# Patient Record
Sex: Female | Born: 1965
Health system: Southern US, Community
[De-identification: ages and names within clinical notes are randomized; demographics above are authoritative.]

## PROBLEM LIST (undated history)

## (undated) DIAGNOSIS — T4145XA Adverse effect of unspecified anesthetic, initial encounter: Secondary | ICD-10-CM

## (undated) DIAGNOSIS — Z9889 Other specified postprocedural states: Secondary | ICD-10-CM

## (undated) DIAGNOSIS — H9193 Unspecified hearing loss, bilateral: Secondary | ICD-10-CM

## (undated) DIAGNOSIS — T8859XA Other complications of anesthesia, initial encounter: Secondary | ICD-10-CM

## (undated) DIAGNOSIS — R12 Heartburn: Secondary | ICD-10-CM

## (undated) DIAGNOSIS — R112 Nausea with vomiting, unspecified: Secondary | ICD-10-CM

## (undated) DIAGNOSIS — F419 Anxiety disorder, unspecified: Secondary | ICD-10-CM

## (undated) DIAGNOSIS — G2581 Restless legs syndrome: Secondary | ICD-10-CM

## (undated) DIAGNOSIS — E785 Hyperlipidemia, unspecified: Secondary | ICD-10-CM

## (undated) DIAGNOSIS — B009 Herpesviral infection, unspecified: Secondary | ICD-10-CM

## (undated) HISTORY — DX: Heartburn: R12

## (undated) HISTORY — PX: ENDOMETRIAL ABLATION: SHX621

## (undated) HISTORY — DX: Anxiety disorder, unspecified: F41.9

## (undated) HISTORY — PX: TUBAL LIGATION: SHX77

## (undated) HISTORY — PX: HYSTEROSCOPY: SHX211

## (undated) HISTORY — DX: Hyperlipidemia, unspecified: E78.5

## (undated) HISTORY — PX: DILATION AND CURETTAGE OF UTERUS: SHX78

## (undated) HISTORY — PX: ABDOMINAL HYSTERECTOMY: SHX81

---
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
## 1999-01-27 ENCOUNTER — Encounter: Payer: Self-pay | Admitting: Orthopedic Surgery

## 1999-01-27 ENCOUNTER — Ambulatory Visit (HOSPITAL_COMMUNITY): Admission: RE | Admit: 1999-01-27 | Discharge: 1999-01-27 | Payer: Self-pay | Admitting: Orthopedic Surgery

## 2002-11-23 HISTORY — PX: COCHLEAR IMPLANT: SUR684

## 2005-02-19 ENCOUNTER — Emergency Department: Payer: Self-pay | Admitting: Emergency Medicine

## 2006-11-23 HISTORY — PX: FOOT SURGERY: SHX648

## 2009-11-23 HISTORY — PX: FOOT SURGERY: SHX648

## 2010-06-03 ENCOUNTER — Ambulatory Visit: Payer: Self-pay | Admitting: Otolaryngology

## 2011-12-31 ENCOUNTER — Ambulatory Visit: Payer: Self-pay

## 2013-05-22 ENCOUNTER — Emergency Department: Payer: Self-pay | Admitting: Emergency Medicine

## 2013-05-22 LAB — COMPREHENSIVE METABOLIC PANEL
Albumin: 3.5 g/dL (ref 3.4–5.0)
Alkaline Phosphatase: 93 U/L (ref 50–136)
BUN: 12 mg/dL (ref 7–18)
Calcium, Total: 8.5 mg/dL (ref 8.5–10.1)
Co2: 26 mmol/L (ref 21–32)
Creatinine: 0.79 mg/dL (ref 0.60–1.30)
EGFR (African American): >60
EGFR (Non-African Amer.): >60
Glucose: 92 mg/dL (ref 65–99)
Osmolality: 277 (ref 275–301)
SGPT (ALT): 16 U/L (ref 12–78)
Total Protein: 7 g/dL (ref 6.4–8.2)

## 2013-05-22 LAB — URINALYSIS, COMPLETE
Bacteria: NONE SEEN
Bilirubin,UR: NEGATIVE
Ketone: NEGATIVE
Nitrite: NEGATIVE
Ph: 5 (ref 4.5–8.0)
Protein: NEGATIVE
Specific Gravity: 1.027 (ref 1.003–1.030)

## 2013-05-22 LAB — CBC
HCT: 33.1 % — ABNORMAL LOW (ref 35.0–47.0)
HGB: 10.9 g/dL — ABNORMAL LOW (ref 12.0–16.0)
MCH: 24.1 pg — ABNORMAL LOW (ref 26.0–34.0)
MCHC: 32.9 g/dL (ref 32.0–36.0)
MCV: 73 fL — ABNORMAL LOW (ref 80–100)
Platelet: 197 10*3/uL (ref 150–440)
RBC: 4.51 10*6/uL (ref 3.80–5.20)
RDW: 18.1 % — ABNORMAL HIGH (ref 11.5–14.5)
WBC: 5.4 10*3/uL (ref 3.6–11.0)

## 2014-05-14 ENCOUNTER — Emergency Department: Payer: Self-pay | Admitting: Emergency Medicine

## 2014-05-14 LAB — URINALYSIS, COMPLETE
Bacteria: NONE SEEN
Bilirubin,UR: NEGATIVE
Ketone: NEGATIVE
LEUKOCYTE ESTERASE: NEGATIVE
NITRITE: NEGATIVE
PH: 5 (ref 4.5–8.0)
RBC,UR: 13511 /HPF (ref 0–5)
SPECIFIC GRAVITY: 1.024 (ref 1.003–1.030)
Squamous Epithelial: 11

## 2014-05-14 LAB — CBC
HCT: 28.6 % — ABNORMAL LOW (ref 35.0–47.0)
HGB: 9.3 g/dL — ABNORMAL LOW (ref 12.0–16.0)
MCH: 26.4 pg (ref 26.0–34.0)
MCHC: 32.4 g/dL (ref 32.0–36.0)
MCV: 82 fL (ref 80–100)
PLATELETS: 168 10*3/uL (ref 150–440)
RBC: 3.51 10*6/uL — AB (ref 3.80–5.20)
RDW: 17.2 % — ABNORMAL HIGH (ref 11.5–14.5)
WBC: 4.5 10*3/uL (ref 3.6–11.0)

## 2014-05-14 LAB — HCG, QUANTITATIVE, PREGNANCY: Beta Hcg, Quant.: <1 m[IU]/mL — ABNORMAL LOW

## 2014-06-19 ENCOUNTER — Ambulatory Visit: Payer: Self-pay | Admitting: Obstetrics and Gynecology

## 2014-06-19 LAB — CBC
HCT: 38.6 % (ref 35.0–47.0)
HGB: 12.7 g/dL (ref 12.0–16.0)
MCH: 30.6 pg (ref 26.0–34.0)
MCHC: 32.8 g/dL (ref 32.0–36.0)
MCV: 93 fL (ref 80–100)
Platelet: 143 10*3/uL — ABNORMAL LOW (ref 150–440)
RBC: 4.13 10*6/uL (ref 3.80–5.20)
RDW: 17.9 % — AB (ref 11.5–14.5)
WBC: 4.4 10*3/uL (ref 3.6–11.0)

## 2014-06-19 LAB — COMPREHENSIVE METABOLIC PANEL
ALK PHOS: 70 U/L
ANION GAP: 8 (ref 7–16)
AST: 21 U/L (ref 15–37)
Albumin: 3.3 g/dL — ABNORMAL LOW (ref 3.4–5.0)
BILIRUBIN TOTAL: 0.3 mg/dL (ref 0.2–1.0)
BUN: 9 mg/dL (ref 7–18)
CALCIUM: 8.7 mg/dL (ref 8.5–10.1)
CREATININE: 0.56 mg/dL — AB (ref 0.60–1.30)
Chloride: 104 mmol/L (ref 98–107)
Co2: 26 mmol/L (ref 21–32)
EGFR (African American): >60
Glucose: 93 mg/dL (ref 65–99)
Osmolality: 274 (ref 275–301)
Potassium: 3.9 mmol/L (ref 3.5–5.1)
SGPT (ALT): 16 U/L
Sodium: 138 mmol/L (ref 136–145)
Total Protein: 6.4 g/dL (ref 6.4–8.2)

## 2014-06-28 ENCOUNTER — Ambulatory Visit: Payer: Self-pay | Admitting: Obstetrics and Gynecology

## 2014-07-02 LAB — PATHOLOGY REPORT

## 2014-08-18 ENCOUNTER — Observation Stay: Payer: Self-pay | Admitting: Obstetrics and Gynecology

## 2014-08-18 LAB — PREGNANCY, URINE: PREGNANCY TEST, URINE: NEGATIVE m[IU]/mL

## 2014-08-18 LAB — BASIC METABOLIC PANEL
Anion Gap: 6 — ABNORMAL LOW (ref 7–16)
BUN: 13 mg/dL (ref 7–18)
CO2: 23 mmol/L (ref 21–32)
CREATININE: 0.79 mg/dL (ref 0.60–1.30)
Calcium, Total: 7.3 mg/dL — ABNORMAL LOW (ref 8.5–10.1)
Chloride: 109 mmol/L — ABNORMAL HIGH (ref 98–107)
EGFR (African American): >60
EGFR (Non-African Amer.): >60
Glucose: 121 mg/dL — ABNORMAL HIGH (ref 65–99)
Osmolality: 277 (ref 275–301)
Potassium: 3.6 mmol/L (ref 3.5–5.1)
Sodium: 138 mmol/L (ref 136–145)

## 2014-08-18 LAB — CBC
HCT: 16.7 % — AB (ref 35.0–47.0)
HGB: 5.8 g/dL — ABNORMAL LOW (ref 12.0–16.0)
MCH: 32.3 pg (ref 26.0–34.0)
MCHC: 34.6 g/dL (ref 32.0–36.0)
MCV: 93 fL (ref 80–100)
PLATELETS: 144 10*3/uL — AB (ref 150–440)
RBC: 1.79 10*6/uL — ABNORMAL LOW (ref 3.80–5.20)
RDW: 14.8 % — ABNORMAL HIGH (ref 11.5–14.5)
WBC: 10.9 10*3/uL (ref 3.6–11.0)

## 2014-08-18 LAB — URINALYSIS, COMPLETE
BACTERIA: NONE SEEN
BACTERIA: NONE SEEN
RBC,UR: 6953 /HPF (ref 0–5)
RBC,UR: 8606 /HPF (ref 0–5)
SPECIFIC GRAVITY: 1.025 (ref 1.003–1.030)
SPECIFIC GRAVITY: 1.02 (ref 1.003–1.030)
SQUAMOUS EPITHELIAL: NONE SEEN
Squamous Epithelial: 3
WBC UR: 170 /HPF (ref 0–5)
WBC UR: 450 /HPF (ref 0–5)

## 2014-08-18 LAB — CBC WITH DIFFERENTIAL/PLATELET
BASOS ABS: 0.1 10*3/uL (ref 0.0–0.1)
Basophil %: 0.9 %
EOS PCT: 0.4 %
Eosinophil #: 0 10*3/uL (ref 0.0–0.7)
HCT: 22.3 % — ABNORMAL LOW (ref 35.0–47.0)
HGB: 7.3 g/dL — AB (ref 12.0–16.0)
Lymphocyte #: 2 10*3/uL (ref 1.0–3.6)
Lymphocyte %: 18.8 %
MCH: 29.5 pg (ref 26.0–34.0)
MCHC: 32.9 g/dL (ref 32.0–36.0)
MCV: 90 fL (ref 80–100)
MONO ABS: 0.6 x10 3/mm (ref 0.2–0.9)
MONOS PCT: 5.4 %
Neutrophil #: 7.7 10*3/uL — ABNORMAL HIGH (ref 1.4–6.5)
Neutrophil %: 74.5 %
Platelet: 113 10*3/uL — ABNORMAL LOW (ref 150–440)
RBC: 2.48 10*6/uL — ABNORMAL LOW (ref 3.80–5.20)
RDW: 16.2 % — ABNORMAL HIGH (ref 11.5–14.5)
WBC: 10.4 10*3/uL (ref 3.6–11.0)

## 2014-08-19 LAB — CBC WITH DIFFERENTIAL/PLATELET
Basophil #: 0 10*3/uL (ref 0.0–0.1)
Basophil %: 0.5 %
Eosinophil #: 0 10*3/uL (ref 0.0–0.7)
Eosinophil %: 0.7 %
HCT: 21.9 % — AB (ref 35.0–47.0)
HGB: 7.2 g/dL — AB (ref 12.0–16.0)
LYMPHS PCT: 27.3 %
Lymphocyte #: 1.8 10*3/uL (ref 1.0–3.6)
MCH: 29.4 pg (ref 26.0–34.0)
MCHC: 32.7 g/dL (ref 32.0–36.0)
MCV: 90 fL (ref 80–100)
Monocyte #: 0.4 x10 3/mm (ref 0.2–0.9)
Monocyte %: 5.5 %
NEUTROS PCT: 66 %
Neutrophil #: 4.3 10*3/uL (ref 1.4–6.5)
Platelet: 104 10*3/uL — ABNORMAL LOW (ref 150–440)
RBC: 2.44 10*6/uL — AB (ref 3.80–5.20)
RDW: 17.3 % — AB (ref 11.5–14.5)
WBC: 6.6 10*3/uL (ref 3.6–11.0)

## 2014-08-22 ENCOUNTER — Ambulatory Visit: Payer: Self-pay | Admitting: Obstetrics and Gynecology

## 2014-08-22 LAB — COMPREHENSIVE METABOLIC PANEL
Albumin: 3 g/dL — ABNORMAL LOW (ref 3.4–5.0)
Alkaline Phosphatase: 46 U/L
Anion Gap: 4 — ABNORMAL LOW (ref 7–16)
BUN: 7 mg/dL (ref 7–18)
Bilirubin,Total: 0.4 mg/dL (ref 0.2–1.0)
Calcium, Total: 8 mg/dL — ABNORMAL LOW (ref 8.5–10.1)
Chloride: 111 mmol/L — ABNORMAL HIGH (ref 98–107)
Co2: 24 mmol/L (ref 21–32)
Creatinine: 0.63 mg/dL (ref 0.60–1.30)
EGFR (African American): >60
EGFR (Non-African Amer.): >60
GLUCOSE: 82 mg/dL (ref 65–99)
Osmolality: 275 (ref 275–301)
Potassium: 4.3 mmol/L (ref 3.5–5.1)
SGOT(AST): 20 U/L (ref 15–37)
SGPT (ALT): 15 U/L
Sodium: 139 mmol/L (ref 136–145)
Total Protein: 6.1 g/dL — ABNORMAL LOW (ref 6.4–8.2)

## 2014-08-22 LAB — CBC
HCT: 27.4 % — AB (ref 35.0–47.0)
HGB: 8.9 g/dL — ABNORMAL LOW (ref 12.0–16.0)
MCH: 30.8 pg (ref 26.0–34.0)
MCHC: 32.6 g/dL (ref 32.0–36.0)
MCV: 95 fL (ref 80–100)
PLATELETS: 161 10*3/uL (ref 150–440)
RBC: 2.9 10*6/uL — AB (ref 3.80–5.20)
RDW: 18.4 % — ABNORMAL HIGH (ref 11.5–14.5)
WBC: 4.6 10*3/uL (ref 3.6–11.0)

## 2014-08-23 ENCOUNTER — Ambulatory Visit: Payer: Self-pay | Admitting: Obstetrics and Gynecology

## 2014-08-25 LAB — PATHOLOGY REPORT

## 2014-10-05 DIAGNOSIS — D649 Anemia, unspecified: Secondary | ICD-10-CM | POA: Insufficient documentation

## 2014-10-05 DIAGNOSIS — K219 Gastro-esophageal reflux disease without esophagitis: Secondary | ICD-10-CM | POA: Insufficient documentation

## 2014-10-06 DIAGNOSIS — E559 Vitamin D deficiency, unspecified: Secondary | ICD-10-CM | POA: Insufficient documentation

## 2014-11-08 ENCOUNTER — Ambulatory Visit: Payer: Self-pay | Admitting: Emergency Medicine

## 2015-03-04 ENCOUNTER — Ambulatory Visit
Admit: 2015-03-04 | Disposition: A | Discharge status: Home / Self Care | Payer: Self-pay | Attending: Family Medicine | Admitting: Family Medicine

## 2015-03-16 NOTE — Op Note (Signed)
PATIENT NAME:  Christina Bates, Christina Bates MR#:  [id number] DATE OF BIRTH:  1966-05-26  DATE OF PROCEDURE:  06/28/2014  PREOPERATIVE DIAGNOSIS: Abnormal uterine bleeding, likely due to polyps.   POSTOPERATIVE DIAGNOSIS: Abnormal uterine bleeding, likely due to polyps.   PROCEDURE:  1. Dilation and curettage.  2. Hysteroscopy.  3. Polypectomy.   ANESTHESIA: General.   SURGEON: Dr. Thomasene MohairStephen Jackson.   ESTIMATED BLOOD LOSS: 100 mL.  OPERATIVE FLUIDS: 700 mL crystalloid.   COMPLICATIONS: None.   FINDINGS: Multiple polypoid endometrial lesions.   SPECIMENS:  1. Endometrial curettings.  2. Endometrial polypoid lesions.   CONDITION AT THE END OF PROCEDURE: Stable.   PROCEDURE IN DETAIL: The patient was taken to the operating room where general anesthesia was administered and found to be adequate. The patient was placed in the dorsal supine high lithotomy position in candy cane stirrups with great care to minimize risk of damage to the nerves. The patient was prepped and draped in the usual sterile fashion. After a timeout was called, a red rubber catheter was placed in the patient's bladder to empty her bladder for the procedure. There was return of approximately 250 mL of clear urine. A sterile speculum was placed in the vagina and a single-tooth tenaculum grasped the anterior lip of the cervix. The cervix was dilated gently in a serial fashion to a dilatation of 6 mm using Hegar dilators. The MyoSure hysteroscope was introduced gently through the cervix into the uterine cavity with the above-noted findings. Using the MyoSure device, the polypoid lesions were removed without difficulty. The MyoSure hysteroscope was removed and a gentle curettage was performed for a global sampling. The hysteroscope was reintroduced through the cervix into the uterus to assure hemostasis, which was noted even after lowering the intrauterine fluid pressure to well below that of the patient's mean arterial pressure. The  hysteroscope was removed from the cervix and vagina. The single-tooth tenaculum was removed from the anterior lip of the cervix and with silver nitrate, hemostasis was obtained. The speculum was removed after assurance that no instrumentation or sponges were left in the vagina.   The patient tolerated the procedure well. Sponge, lap, and needle counts were correct x 2. For VTE prophylaxis, the patient was wearing pneumatic compression stockings throughout the entire procedure. She was awakened in the operating room and taken to the recovery area in stable condition.    ____________________________ Conard NovakStephen D. Jackson, MD sdj:lt D: 06/28/2014 16:10:59 ET T: 06/28/2014 21:00:31 ET JOB#: [id number]  cc: Conard NovakStephen D. Jackson, MD, <Dictator> Conard NovakSTEPHEN D JACKSON MD ELECTRONICALLY SIGNED 08/08/2014 14:47

## 2015-03-16 NOTE — Op Note (Signed)
PATIENT NAME:  Christina Bates, Christina Bates MR#:  [id number] DATE OF BIRTH:  Aug 24, 1966  DATE OF PROCEDURE:  08/23/2014  PREOPERATIVE DIAGNOSES:  1. Menorrhagia with irregular cycles.  2. Acute blood loss anemia due to heavy vaginal bleeding.   POSTOPERATIVE DIAGNOSES:  1. Menorrhagia with irregular cycles.  2. Acute blood loss anemia due to heavy vaginal bleeding.   PROCEDURES:  1. Dilation and curettage.  2. Hysteroscopy.  3. NovaSure endometrial ablation.   ANESTHESIA: General.   SURGEON: Conard NovakStephen D. Delphia Kaylor, M.D.   ESTIMATED BLOOD LOSS: 10 mL.   OPERATIVE FLUIDS: 400 mL crystalloid.   COMPLICATIONS: None.   FINDINGS: Possible posterior endometrial polyp.   SPECIMENS: Endometrial curettings.   CONDITION AT END OF PROCEDURE: Stable.   PROCEDURE IN DETAIL: The patient was taken to the operating room where general anesthesia was administered and found to be adequate. The patient was placed in the dorsal supine high lithotomy position in candy cane stirrups, and prepped and draped in the usual sterile fashion. Great care was  taken to ensure the patient was positioned such as to minimize risk to nerve damage. After a timeout was called, an in and out catheterization was performed for a minimal return of clear urine. A sterile speculum was placed in the vagina and a single-tooth tenaculum was affixed to the anterior lip of the cervix. The uterus was sounded to a depth of 9 cm and the cervical length was measured at 4 cm giving it a cavity depth of 5 cm.   The hysteroscope was then gently introduced into the cavity with the above-noted findings. A gentle curettage was performed for a global sample. The NovaSure device was then introduced through the cervix, and as recommended by the manufacturer, with the usual maneuvers implemented in order to obtain a proper cavity width, which was noted to be 4.6 cm. Cavity assessment was performed using the NovaSure machine according to the manufacturer's  recommendations and the cavity assessment passed. The NovaSure device was then deployed and power was generated, a total of 127 for a total of 95 seconds. The device was removed in the recommended fashion. The hysteroscope was then gently introduced into the cavity to assess the globalization of the ablation. Notably the right cornu had some area without ablation. The cavity was a bit arcuate in shape with the cornu on the right side recessed somewhat deeply; therefore, the deployment was reasonable and no further ablation was performed.   The hysteroscope was removed from the uterine cavity and the single-tooth tenaculum was removed from the cervix with hemostasis noted. The speculum was removed from the vagina with assurance that no instrumentation or sponges were left in the vagina. This terminated the procedure.   The patient tolerated the procedure well. Sponge, lap, and needle counts were correct x 2. For VTE prophylaxis, the patient was wearing pneumatic compression stockings throughout the entire procedure. She was awakened in the operating room and taken to recovery in stable condition.  ____________________________ Conard NovakStephen D. Ryler Laskowski, MD sdj:JT D: 08/23/2014 11:20:42 ET T: 08/23/2014 13:45:58 ET JOB#: [id number]  cc: Conard NovakStephen D. Debbora Ang, MD, <Dictator> Conard NovakSTEPHEN D Renay Crammer MD ELECTRONICALLY SIGNED 09/11/2014 18:48

## 2015-03-16 NOTE — Consult Note (Signed)
Brief Consult Note: Diagnosis: Menorrhgia to anema.   Recommend further assessment or treatment.   Orders entered.   Discussed with Attending MD.   Comments: Case discussed with Dr. Dolores FrameSung acute blood loss anemia secondary to menorrhagia - 2U pRBC - Provera 20mg  tab po tid x 1 week followed by 20mg  tab po daily to manage acute episode of bleeding - May be a novasure or Mirena candidate for long term managment.  Electronic Signatures: Lorrene ReidStaebler, Ehtan Delfavero M (MD)  (Signed 26-Sep-15 03:11)  Authored: Brief Consult Note   Last Updated: 26-Sep-15 03:11 by Lorrene ReidStaebler, Armanda Forand M (MD)

## 2015-03-18 ENCOUNTER — Encounter: Payer: Self-pay | Admitting: Podiatry

## 2015-03-18 ENCOUNTER — Ambulatory Visit (INDEPENDENT_AMBULATORY_CARE_PROVIDER_SITE_OTHER): Payer: No Typology Code available for payment source | Admitting: Podiatry

## 2015-03-18 ENCOUNTER — Ambulatory Visit (INDEPENDENT_AMBULATORY_CARE_PROVIDER_SITE_OTHER): Payer: No Typology Code available for payment source

## 2015-03-18 ENCOUNTER — Other Ambulatory Visit: Payer: Self-pay | Admitting: *Deleted

## 2015-03-18 VITALS — BP 116/79 | HR 69 | Resp 16 | Ht 62.0 in | Wt 155.0 lb

## 2015-03-18 DIAGNOSIS — M76822 Posterior tibial tendinitis, left leg: Secondary | ICD-10-CM | POA: Diagnosis not present

## 2015-03-18 DIAGNOSIS — M79672 Pain in left foot: Secondary | ICD-10-CM

## 2015-03-18 DIAGNOSIS — M779 Enthesopathy, unspecified: Secondary | ICD-10-CM

## 2015-03-18 MED ORDER — MELOXICAM 15 MG PO TABS: 15.0000 mg | ORAL_TABLET | Freq: Every day | ORAL | Status: DC

## 2015-03-18 MED ORDER — METHYLPREDNISOLONE 4 MG PO TBPK: ORAL_TABLET | ORAL | Status: DC

## 2015-03-19 NOTE — Progress Notes (Signed)
She presents today to complain of pain that started several weeks ago a hind that he'll has progressed along side of the foot extending across the autumn of the foot particularly around Lisfranc's joints from medial to lateral. She also is radiating pain. She denies any trauma to the foot. She states however now the majority of the pain is writing here patient points to the medial aspect of the foot and ankle. She denies changes in her past medical history medications allergies surgeries and social history.  Objective: Vital signs are stable she is alert and oriented 3. Pulses are palpable bilateral. Neurologic sensorium is intact. She has no pain on palpation of the Achilles tendon she does have mild tenderness on palpation of the plantar fascia left the majority of her pain is along the medial aspect of the foot coursing along with the posterior tibial tendon particularly beneath the medial malleolus extending to the navicular tuberosity and plantarly. Radiographs do not demonstrate any type of osseus abnormalities in this area.  Assessment: Posterior tibial tendinitis with fluctuance within the tendon sheath. Left foot.  Plan: Placed her in a Cam Walker. Started her on a Medrol Dosepak to be followed by meloxicam.

## 2015-03-20 DIAGNOSIS — E538 Deficiency of other specified B group vitamins: Secondary | ICD-10-CM | POA: Insufficient documentation

## 2015-04-15 ENCOUNTER — Encounter: Payer: Self-pay | Admitting: Podiatry

## 2015-04-15 ENCOUNTER — Ambulatory Visit (INDEPENDENT_AMBULATORY_CARE_PROVIDER_SITE_OTHER): Payer: No Typology Code available for payment source | Admitting: Podiatry

## 2015-04-15 VITALS — Ht 62.0 in | Wt 155.0 lb

## 2015-04-15 DIAGNOSIS — M76822 Posterior tibial tendinitis, left leg: Secondary | ICD-10-CM | POA: Diagnosis not present

## 2015-04-15 DIAGNOSIS — M79672 Pain in left foot: Secondary | ICD-10-CM | POA: Diagnosis not present

## 2015-04-15 NOTE — Progress Notes (Signed)
She presents today with her husband for follow-up of her posterior tibial tendinitis of her left foot. She states and he confirms that she continues to wear her short cam walker on a regular basis. She states that it no longer hurts.  Objective: Vital signs are stable alert and oriented 3. Pulses are palpable left. She has mild fluctuance on palpation of the posterior tibial tendon just inferior to medial malleolus. She has mild tenderness on palpation of the posterior tibial tendon itself.  Assessment: Resolving posterior tibial tendinitis left.  Plan: Placed her in a Tri-Lock brace and will follow up with her in 1 month to 6 weeks

## 2015-05-29 ENCOUNTER — Ambulatory Visit (INDEPENDENT_AMBULATORY_CARE_PROVIDER_SITE_OTHER): Payer: No Typology Code available for payment source | Admitting: Podiatry

## 2015-05-29 ENCOUNTER — Encounter: Payer: Self-pay | Admitting: Podiatry

## 2015-05-29 DIAGNOSIS — M76822 Posterior tibial tendinitis, left leg: Secondary | ICD-10-CM

## 2015-05-29 NOTE — Progress Notes (Signed)
She presents today for follow-up of her posterior tibial tendinitis of her left foot. She states this seems to be doing fine I have very little pain. She is no longer taking the meloxicam. She does confirm that she continues to wear her ankle brace.  Objective: Vital signs are stable alert and oriented 3. Pulses are strongly palpable. She has very mild tenderness on palpation of the posterior tibial tendon at the inferior most aspect of the medial malleolus. There is a small amount of fluctuance in this area. She has full range of motion and full strength in this area.  Assessment: Posterior tibial tendinitis left.  Plan: I encouraged her to continue anti-inflammatory, the ankle brace and ice therapy. I will follow up with her in 6 weeks or as needed.

## 2015-07-23 ENCOUNTER — Ambulatory Visit
Admit: 2015-07-23 | Discharge: 2015-07-23 | Disposition: A | Discharge status: Home / Self Care | Payer: No Typology Code available for payment source | Attending: Emergency Medicine | Admitting: Emergency Medicine

## 2015-07-23 ENCOUNTER — Encounter: Payer: Self-pay | Admitting: Emergency Medicine

## 2015-07-23 ENCOUNTER — Ambulatory Visit
Admission: EM | Admit: 2015-07-23 | Discharge: 2015-07-23 | Disposition: A | Discharge status: Home / Self Care | Payer: No Typology Code available for payment source | Attending: Family Medicine | Admitting: Family Medicine

## 2015-07-23 DIAGNOSIS — R519 Headache, unspecified: Secondary | ICD-10-CM

## 2015-07-23 DIAGNOSIS — R51 Headache: Secondary | ICD-10-CM

## 2015-07-23 MED ORDER — KETOROLAC TROMETHAMINE 60 MG/2ML IM SOLN
60.0000 mg | Freq: Once | INTRAMUSCULAR | Status: AC
  Administered 2015-07-23: 60 mg via INTRAMUSCULAR

## 2015-07-23 MED ORDER — PROMETHAZINE HCL 25 MG/ML IJ SOLN
12.5000 mg | Freq: Four times a day (QID) | INTRAMUSCULAR | Status: DC | PRN
  Administered 2015-07-23: 12.5 mg via INTRAMUSCULAR

## 2015-07-23 NOTE — ED Notes (Signed)
Scheduled CT of head w/o contrast at [facility].

## 2015-07-23 NOTE — ED Provider Notes (Signed)
[facility] Emergency Department Provider Note  ____________________________________________  Time seen: Approximately 3:08 PM  I have reviewed the triage vital signs and the nursing notes.   HISTORY  Chief Complaint Headache   HPI Christina Bates is a 49 y.o. female presents with husband at bedside for the complaints of right-sided headache. Patient states that right-sided headache onset was yesterday morning present upon wakening. States yesterday headache was constant with intermittent sharp stabbing right-sided pains. Describes pain as a throbbing sensation. States she took over-the-counter ibuprofen and a "left over Vicodin "which helped but did not fully resolve pain. Patient states that pain was still present when she went to bed last night. States the pain had dramatically improved this morning when she woke up and no longer constant. States that she was originally not going to seek care but then she still noticed that she still having intermittent right throbbing pain. States that intermittent pain varies and may come on several times per hour to once. States that it is not constant. States that she is currently not in any pain. States that when pain is present it is 7 out of 10 and throbbing to the right side. Denies pain radiation. States there is nothing that she can do that worsens pain. Denies pain changes with positions changes.   Denies current pain. Denies dizziness, vision changes, fall, head injury, loss consciousness, neck pain, back pain. Denies fever. Denies recent sickness. Denies sinus congestion, cough, fever. Denies chest pain, shortness of breath, abdominal pain. Reports continues to eat and drink well.  Reports she has a history of some headaches. States these headaches are primarily tension type headaches and this feels different.   History reviewed. No pertinent past medical history.  Patient Active Problem List   Diagnosis Date Noted   . Avitaminosis D 10/06/2014  . Absolute anemia 10/05/2014  . Gastro-esophageal reflux disease without esophagitis 10/05/2014    Past Surgical History  Procedure Laterality Date  . Cochlear implant      Current Outpatient Rx  Name  Route  Sig  Dispense  Refill  . ferrous sulfate 325 (65 FE) MG tablet   Oral   Take 325 mg by mouth daily with breakfast.         . ibuprofen (ADVIL,MOTRIN) 600 MG tablet            1   . meloxicam (MOBIC) 15 MG tablet   Oral   Take 1 tablet (15 mg total) by mouth daily.   30 tablet   2   . methylPREDNISolone (MEDROL DOSEPAK) 4 MG TBPK tablet      Tapering 6 day dos   21 tablet   0   . pantoprazole (PROTONIX) 40 MG tablet            2   . valACYclovir (VALTREX) 1000 MG tablet            3   . Vitamin D, Ergocalciferol, (DRISDOL) 50000 UNITS CAPS capsule            0     Allergies Bacitracin  History reviewed. No pertinent family history.  Social History Social History  Substance Use Topics  . Smoking status: Never Smoker   . Smokeless tobacco: None  . Alcohol Use: 0.0 oz/week    0 Standard drinks or equivalent per week    Review of Systems Constitutional: No fever/chills Eyes: No visual changes. ENT: No sore throat. Cardiovascular: Denies chest pain. Respiratory: Denies shortness of  breath. Gastrointestinal: No abdominal pain.  No nausea, no vomiting.  No diarrhea.  No constipation. Genitourinary: Negative for dysuria. Musculoskeletal: Negative for back pain. Skin: Negative for rash. Neurological: positive for headaches. Negative for focal weakness or numbness.  10-point ROS otherwise negative.  ____________________________________________   PHYSICAL EXAM:  VITAL SIGNS: ED Triage Vitals  Enc Vitals Group     BP 07/23/15 1441 129/77 mmHg     Pulse Rate 07/23/15 1441 68     Resp 07/23/15 1441 16     Temp 07/23/15 1441 97.2 F (36.2 C)     Temp Source 07/23/15 1441 Oral     SpO2 07/23/15 1441  100 %     Weight 07/23/15 1441 150 lb (68.04 kg)     Height 07/23/15 1441  (1.575 m)     Head Cir --      Peak Flow --      Pain Score 07/23/15 1445 9     Pain Loc --      Pain Edu? --      Excl. in GC? --     Constitutional: Alert and oriented. Well appearing and in no acute distress. Eyes: Conjunctivae are normal. PERRL. EOMI. Anterior chambers with limited bedside exam normal, no papilledema.  Head: Atraumatic.no tenderness over temporal arteries. No sinus TTP. Left sided cochlear implant. Nontender. No tenderness to palpation.   Ears: no erythema, normal TMs bilaterally.   Nose: No congestion/rhinnorhea.  Mouth/Throat: Mucous membranes are moist.  Oropharynx non-erythematous. Neck: No stridor.  No cervical spine tenderness to palpation. Hematological/Lymphatic/Immunilogical: No cervical lymphadenopathy. Cardiovascular: Normal rate, regular rhythm. Grossly normal heart sounds.  Good peripheral circulation. Respiratory: Normal respiratory effort.  No retractions. Lungs CTAB. Gastrointestinal: Soft and nontender. No distention. Normal Bowel sounds.  No abdominal bruits. No CVA tenderness. Musculoskeletal: No lower or upper extremity tenderness nor edema.  No joint effusions. Bilateral pedal pulses equal and easily palpated. No cervical, thoracic or lumbar TTP.  Neurologic:  Normal speech and language. No gross focal neurologic deficits are appreciated. CN 2-12 intact. No gait instability. No ataxia, normal finger to nose. Negative Romberg. Negative brudzkinski's and negative kernig's. No meningismus.   Skin:  Skin is warm, dry and intact. No rash noted. Psychiatric: Mood and affect are normal. Speech and behavior are normal. _________________________________________   LABS (all labs ordered are listed, but only abnormal results are displayed)  Labs Reviewed - No data to display ____________________________________________  RADIOLOGY EXAM: CT HEAD WITHOUT  CONTRAST  TECHNIQUE: Contiguous axial images were obtained from the base of the skull through the vertex without intravenous contrast.  COMPARISON: None.  FINDINGS: Moderate artifact related to LEFT cochlear implant. Overall the study is diagnostic.  No evidence for acute infarction, hemorrhage, mass lesion, hydrocephalus, or extra-axial fluid. Normal cerebral volume. No white matter disease. Calvarium is intact except for small wires which penetrate the LEFT temporal bone, related to the cochlear implant device. Prior LEFT mastoidectomy. No sinus or mastoid air fluid level. Negative orbits.  IMPRESSION: Negative exam. Postsurgical changes related to LEFT cochlear implant.   Electronically Signed By: Elsie Stain M.D. On: 07/23/2015 16:37      I, Renford Dills, personally viewed and evaluated these images (plain radiographs) as part of my medical decision making.   ____________________________________________   PROCEDURES  IM toradol  and phenergan 12.5mg  once.  ____________________________________________   INITIAL IMPRESSION / ASSESSMENT AND PLAN / ED COURSE  Pertinent labs & imaging results that were available during my care of  the patient were reviewed by me and considered in my medical decision making (see chart for details).  Very well-appearing patient. No acute distress. Presents with husband at bedside. Reports intermittent headache today. States headache onset was yesterday morning when she awoke. Denies head injury or loss consciousness. Stable patient. No focal neurological deficits. Presents for atypical headache for her. Will evaluate by CT scan.   1555 CT scan currently unavailable at this facility. Patient and spouse reports wants to go forward with CT of head and reports wants to drive to hospital for CT. Ct head ordered. RN called hospital and arranged and directed patient. Headache present x 2 days. Patient alert and oriented with  decisional capacity. Patient and husband reports will return to Urgent care for follow up post CT result.   1637: Ct head negative. No acute intracranial infarction, hemorrhage, mass lesion hydrocephalus or extra axial fluid.   1715: Patient and spouse at bedside. IM toradol and phenergan x one. States pain was 3/10 just prior to IM medication, but states improving. States she is feeling better. Denies other complaints. No neurological deficits. States she is feeling better and wants to go home. Reports headache improved.   Discussed strict follow up with PCP. Follow up with PCP Dr Thedore Mins this week. Discussed immediate return parameters including going to ER, including increased pain, confusion, weakness, dizziness, visions changes, or worsening concerns. Patient and spouse verbalized understanding and agreed to plan.    ____________________________________________   FINAL CLINICAL IMPRESSION(S) / ED DIAGNOSES  Final diagnoses:  Acute nonintractable headache, unspecified headache type       Renford Dills, NP 07/23/15 2152

## 2015-07-23 NOTE — ED Notes (Signed)
Patient c/o stabbing pain in her head that comes and goes and started yesterday morning.  Patient denies vomiting.

## 2015-07-23 NOTE — ED Notes (Signed)
Patient has left with spouse and is being taken over by private vehicle to [facility] for her CT scan.  Patient instructed to check-in at the Registration Desk in [facility] [place] at [facility].  Patient will be turning back to [facility] after her CT scan for possible administration of pain medication for her headache.  Patient verbalized understanding.

## 2015-07-23 NOTE — Discharge Instructions (Signed)
Rest. Drink plenty of water. Avoid stressful triggers and over strenuous activity.   Follow up with your primary care physician. Follow up closely this week. This is important.   Return to Urgent care or ER for increased headache, headache not resolving, confusion, dizziness, weakness, vision changes, new or worsening concerns.   Headaches, Frequently Asked Questions MIGRAINE HEADACHES Q: What is migraine? What causes it? How can I treat it? A: Generally, migraine headaches begin as a dull ache. Then they develop into a constant, throbbing, and pulsating pain. You may experience pain at the temples. You may experience pain at the front or back of one or both sides of the head. The pain is usually accompanied by a combination of:  Nausea.  Vomiting.  Sensitivity to light and noise. Some people (about 15%) experience an aura (see below) before an attack. The cause of migraine is believed to be chemical reactions in the brain. Treatment for migraine may include over-the-counter or prescription medications. It may also include self-help techniques. These include relaxation training and biofeedback.  Q: What is an aura? A: About 15% of people with migraine get an "aura". This is a sign of neurological symptoms that occur before a migraine headache. You may see wavy or jagged lines, dots, or flashing lights. You might experience tunnel vision or blind spots in one or both eyes. The aura can include visual or auditory hallucinations (something imagined). It may include disruptions in smell (such as strange odors), taste or touch. Other symptoms include:  Numbness.  A "pins and needles" sensation.  Difficulty in recalling or speaking the correct word. These neurological events may last as long as 60 minutes. These symptoms will fade as the headache begins. Q: What is a trigger? A: Certain physical or environmental factors can lead to or "trigger" a migraine. These include:  Foods.  Hormonal  changes.  Weather.  Stress. It is important to remember that triggers are different for everyone. To help prevent migraine attacks, you need to figure out which triggers affect you. Keep a headache diary. This is a good way to track triggers. The diary will help you talk to your healthcare professional about your condition. Q: Does weather affect migraines? A: Bright sunshine, hot, humid conditions, and drastic changes in barometric pressure may lead to, or "trigger," a migraine attack in some people. But studies have shown that weather does not act as a trigger for everyone with migraines. Q: What is the link between migraine and hormones? A: Hormones start and regulate many of your body's functions. Hormones keep your body in balance within a constantly changing environment. The levels of hormones in your body are unbalanced at times. Examples are during menstruation, pregnancy, or menopause. That can lead to a migraine attack. In fact, about three quarters of all women with migraine report that their attacks are related to the menstrual cycle.  Q: Is there an increased risk of stroke for migraine sufferers? A: The likelihood of a migraine attack causing a stroke is very remote. That is not to say that migraine sufferers cannot have a stroke associated with their migraines. In persons under age 39, the most common associated factor for stroke is migraine headache. But over the course of a person's normal life span, the occurrence of migraine headache may actually be associated with a reduced risk of dying from cerebrovascular disease due to stroke.  Q: What are acute medications for migraine? A: Acute medications are used to treat the pain of the headache  after it has started. Examples over-the-counter medications, NSAIDs, ergots, and triptans.  Q: What are the triptans? A: Triptans are the newest class of abortive medications. They are specifically targeted to treat migraine. Triptans are  vasoconstrictors. They moderate some chemical reactions in the brain. The triptans work on receptors in your brain. Triptans help to restore the balance of a neurotransmitter called serotonin. Fluctuations in levels of serotonin are thought to be a main cause of migraine.  Q: Are over-the-counter medications for migraine effective? A: Over-the-counter, or "OTC," medications may be effective in relieving mild to moderate pain and associated symptoms of migraine. But you should see your caregiver before beginning any treatment regimen for migraine.  Q: What are preventive medications for migraine? A: Preventive medications for migraine are sometimes referred to as "prophylactic" treatments. They are used to reduce the frequency, severity, and length of migraine attacks. Examples of preventive medications include antiepileptic medications, antidepressants, beta-blockers, calcium channel blockers, and NSAIDs (nonsteroidal anti-inflammatory drugs). Q: Why are anticonvulsants used to treat migraine? A: During the past few years, there has been an increased interest in antiepileptic drugs for the prevention of migraine. They are sometimes referred to as "anticonvulsants". Both epilepsy and migraine may be caused by similar reactions in the brain.  Q: Why are antidepressants used to treat migraine? A: Antidepressants are typically used to treat people with depression. They may reduce migraine frequency by regulating chemical levels, such as serotonin, in the brain.  Q: What alternative therapies are used to treat migraine? A: The term "alternative therapies" is often used to describe treatments considered outside the scope of conventional Western medicine. Examples of alternative therapy include acupuncture, acupressure, and yoga. Another common alternative treatment is herbal therapy. Some herbs are believed to relieve headache pain. Always discuss alternative therapies with your caregiver before proceeding. Some  herbal products contain arsenic and other toxins. TENSION HEADACHES Q: What is a tension-type headache? What causes it? How can I treat it? A: Tension-type headaches occur randomly. They are often the result of temporary stress, anxiety, fatigue, or anger. Symptoms include soreness in your temples, a tightening band-like sensation around your head (a "vice-like" ache). Symptoms can also include a pulling feeling, pressure sensations, and contracting head and neck muscles. The headache begins in your forehead, temples, or the back of your head and neck. Treatment for tension-type headache may include over-the-counter or prescription medications. Treatment may also include self-help techniques such as relaxation training and biofeedback. CLUSTER HEADACHES Q: What is a cluster headache? What causes it? How can I treat it? A: Cluster headache gets its name because the attacks come in groups. The pain arrives with little, if any, warning. It is usually on one side of the head. A tearing or bloodshot eye and a runny nose on the same side of the headache may also accompany the pain. Cluster headaches are believed to be caused by chemical reactions in the brain. They have been described as the most severe and intense of any headache type. Treatment for cluster headache includes prescription medication and oxygen. SINUS HEADACHES Q: What is a sinus headache? What causes it? How can I treat it? A: When a cavity in the bones of the face and skull (a sinus) becomes inflamed, the inflammation will cause localized pain. This condition is usually the result of an allergic reaction, a tumor, or an infection. If your headache is caused by a sinus blockage, such as an infection, you will probably have a fever. An x-ray will confirm  a sinus blockage. Your caregiver's treatment might include antibiotics for the infection, as well as antihistamines or decongestants.  REBOUND HEADACHES Q: What is a rebound headache? What  causes it? How can I treat it? A: A pattern of taking acute headache medications too often can lead to a condition known as "rebound headache." A pattern of taking too much headache medication includes taking it more than 2 days per week or in excessive amounts. That means more than the label or a caregiver advises. With rebound headaches, your medications not only stop relieving pain, they actually begin to cause headaches. Doctors treat rebound headache by tapering the medication that is being overused. Sometimes your caregiver will gradually substitute a different type of treatment or medication. Stopping may be a challenge. Regularly overusing a medication increases the potential for serious side effects. Consult a caregiver if you regularly use headache medications more than 2 days per week or more than the label advises. ADDITIONAL QUESTIONS AND ANSWERS Q: What is biofeedback? A: Biofeedback is a self-help treatment. Biofeedback uses special equipment to monitor your body's involuntary physical responses. Biofeedback monitors:  Breathing.  Pulse.  Heart rate.  Temperature.  Muscle tension.  Brain activity. Biofeedback helps you refine and perfect your relaxation exercises. You learn to control the physical responses that are related to stress. Once the technique has been mastered, you do not need the equipment any more. Q: Are headaches hereditary? A: Four out of five (80%) of people that suffer report a family history of migraine. Scientists are not sure if this is genetic or a family predisposition. Despite the uncertainty, a child has a 50% chance of having migraine if one parent suffers. The child has a 75% chance if both parents suffer.  Q: Can children get headaches? A: By the time they reach high school, most young people have experienced some type of headache. Many safe and effective approaches or medications can prevent a headache from occurring or stop it after it has begun.  Q:  What type of doctor should I see to diagnose and treat my headache? A: Start with your primary caregiver. Discuss his or her experience and approach to headaches. Discuss methods of classification, diagnosis, and treatment. Your caregiver may decide to recommend you to a headache specialist, depending upon your symptoms or other physical conditions. Having diabetes, allergies, etc., may require a more comprehensive and inclusive approach to your headache. The National Head[employer] Foundation will provide, upon request, a list of [facility] physician members in your state. Document Released: 01/30/2004 Document Revised: 02/01/2012 Document Reviewed: 07/09/2008 [facility] Patient Information 2015 [place], [place]. This information is not intended to replace advice given to you by your health care provider. Make sure you discuss any questions you have with your health care provider.

## 2015-08-15 DIAGNOSIS — E538 Deficiency of other specified B group vitamins: Secondary | ICD-10-CM | POA: Insufficient documentation

## 2015-08-15 DIAGNOSIS — A6000 Herpesviral infection of urogenital system, unspecified: Secondary | ICD-10-CM | POA: Insufficient documentation

## 2015-11-12 ENCOUNTER — Encounter
Admission: RE | Admit: 2015-11-12 | Discharge: 2015-11-12 | Disposition: A | Discharge status: Home / Self Care | Payer: 59 | Source: Ambulatory Visit | Attending: Obstetrics and Gynecology | Admitting: Obstetrics and Gynecology

## 2015-11-12 DIAGNOSIS — Z01812 Encounter for preprocedural laboratory examination: Secondary | ICD-10-CM | POA: Insufficient documentation

## 2015-11-12 HISTORY — DX: Adverse effect of unspecified anesthetic, initial encounter: T41.45XA

## 2015-11-12 HISTORY — DX: Other complications of anesthesia, initial encounter: T88.59XA

## 2015-11-12 HISTORY — DX: Other specified postprocedural states: Z98.890

## 2015-11-12 HISTORY — DX: Unspecified hearing loss, bilateral: H91.93

## 2015-11-12 HISTORY — DX: Herpesviral infection, unspecified: B00.9

## 2015-11-12 HISTORY — DX: Restless legs syndrome: G25.81

## 2015-11-12 HISTORY — DX: Nausea with vomiting, unspecified: R11.2

## 2015-11-12 LAB — COMPREHENSIVE METABOLIC PANEL
ALT: 13 U/L — ABNORMAL LOW (ref 14–54)
AST: 18 U/L (ref 15–41)
Albumin: 4 g/dL (ref 3.5–5.0)
Alkaline Phosphatase: 63 U/L (ref 38–126)
Anion gap: 6 (ref 5–15)
BILIRUBIN TOTAL: 0.7 mg/dL (ref 0.3–1.2)
BUN: 15 mg/dL (ref 6–20)
CHLORIDE: 106 mmol/L (ref 101–111)
CO2: 25 mmol/L (ref 22–32)
CREATININE: 0.54 mg/dL (ref 0.44–1.00)
Calcium: 8.6 mg/dL — ABNORMAL LOW (ref 8.9–10.3)
Glucose, Bld: 89 mg/dL (ref 65–99)
POTASSIUM: 4 mmol/L (ref 3.5–5.1)
Sodium: 137 mmol/L (ref 135–145)
TOTAL PROTEIN: 6.9 g/dL (ref 6.5–8.1)

## 2015-11-12 LAB — CBC
HEMATOCRIT: 41.8 % (ref 35.0–47.0)
Hemoglobin: 14 g/dL (ref 12.0–16.0)
MCH: 31.1 pg (ref 26.0–34.0)
MCHC: 33.5 g/dL (ref 32.0–36.0)
MCV: 92.9 fL (ref 80.0–100.0)
PLATELETS: 136 10*3/uL — AB (ref 150–440)
RBC: 4.5 MIL/uL (ref 3.80–5.20)
RDW: 13.5 % (ref 11.5–14.5)
WBC: 4.5 10*3/uL (ref 3.6–11.0)

## 2015-11-12 LAB — TYPE AND SCREEN
ABO/RH(D): O POS
ANTIBODY SCREEN: NEGATIVE

## 2015-11-12 LAB — ABO/RH: ABO/RH(D): O POS

## 2015-11-12 NOTE — Patient Instructions (Signed)
  Your procedure is scheduled on: Tuesday Dec. 9, 2016. Report to Same Day Surgery. To find out your arrival time please call [phone] between 1PM - 3PM on Friday Dec. 23, 2016 .  Remember: Instructions that are not followed completely may result in serious medical risk, up to and including death, or upon the discretion of your surgeon and anesthesiologist your surgery may need to be rescheduled.    _x___ 1. Do not eat food or drink liquids after midnight. No gum chewing or hard candies.     _x___ 2. No Alcohol for 24 hours before or after surgery.   ____ 3. Bring all medications with you on the day of surgery if instructed.    __x__ 4. Notify your doctor if there is any change in your medical condition     (cold, fever, infections).     Do not wear jewelry, make-up, hairpins, clips or nail polish.  Do not wear lotions, powders, or perfumes. You may wear deodorant.  Do not shave 48 hours prior to surgery. Men may shave face and neck.  Do not bring valuables to the hospital.    [facility] is not responsible for any belongings or valuables.               Contacts, dentures or bridgework may not be worn into surgery.  Leave your suitcase in the car. After surgery it may be brought to your room.  For patients admitted to the hospital, discharge time is determined by your treatment team.   Patients discharged the day of surgery will not be allowed to drive home.    Please read over the following fact sheets that you were given:   [facility]  ____ Take these medicines the morning of surgery with A SIP OF WATER:    1. Protonix at night like you usually do and again the morning of surgery   ____ Fleet Enema (as directed)   __x__ Use CHG Soap as directed  ____ Use inhalers on the day of surgery  ____ Stop metformin 2 days prior to surgery    ____ Take 1/2 of usual insulin dose the night before surgery and none on the morning of surgery.   ____ Stop  Coumadin/Plavix/aspirin on does not apply.  _x___ Stop Anti-inflammatories on such as Ibuprofen today.  OK to take Tylenol for pain   _x___ Stop supplements such as vitamin D until after surgery.    ____ Bring C-Pap to the hospital.

## 2015-11-19 ENCOUNTER — Encounter
Admission: RE | Disposition: A | Discharge status: Home / Self Care | Payer: Self-pay | Source: Ambulatory Visit | Attending: Obstetrics and Gynecology

## 2015-11-19 ENCOUNTER — Ambulatory Visit: Payer: 59 | Admitting: Certified Registered Nurse Anesthetist

## 2015-11-19 ENCOUNTER — Encounter: Payer: Self-pay | Admitting: Anesthesiology

## 2015-11-19 ENCOUNTER — Observation Stay
Admission: RE | Admit: 2015-11-19 | Discharge: 2015-11-20 | Disposition: A | Discharge status: Home / Self Care | Payer: 59 | Source: Ambulatory Visit | Attending: Obstetrics and Gynecology | Admitting: Obstetrics and Gynecology

## 2015-11-19 DIAGNOSIS — N946 Dysmenorrhea, unspecified: Secondary | ICD-10-CM | POA: Diagnosis present

## 2015-11-19 DIAGNOSIS — N800 Endometriosis of uterus: Principal | ICD-10-CM | POA: Insufficient documentation

## 2015-11-19 DIAGNOSIS — Z87891 Personal history of nicotine dependence: Secondary | ICD-10-CM | POA: Insufficient documentation

## 2015-11-19 DIAGNOSIS — G2581 Restless legs syndrome: Secondary | ICD-10-CM | POA: Insufficient documentation

## 2015-11-19 DIAGNOSIS — N838 Other noninflammatory disorders of ovary, fallopian tube and broad ligament: Secondary | ICD-10-CM | POA: Diagnosis not present

## 2015-11-19 DIAGNOSIS — Z90710 Acquired absence of both cervix and uterus: Secondary | ICD-10-CM | POA: Diagnosis present

## 2015-11-19 DIAGNOSIS — Z9621 Cochlear implant status: Secondary | ICD-10-CM | POA: Insufficient documentation

## 2015-11-19 DIAGNOSIS — N921 Excessive and frequent menstruation with irregular cycle: Secondary | ICD-10-CM | POA: Diagnosis present

## 2015-11-19 DIAGNOSIS — H9193 Unspecified hearing loss, bilateral: Secondary | ICD-10-CM | POA: Insufficient documentation

## 2015-11-19 DIAGNOSIS — N926 Irregular menstruation, unspecified: Secondary | ICD-10-CM | POA: Diagnosis not present

## 2015-11-19 DIAGNOSIS — N920 Excessive and frequent menstruation with regular cycle: Secondary | ICD-10-CM | POA: Insufficient documentation

## 2015-11-19 HISTORY — PX: CYSTOSCOPY: SHX5120

## 2015-11-19 HISTORY — PX: LAPAROSCOPIC HYSTERECTOMY: SHX1926

## 2015-11-19 LAB — POCT PREGNANCY, URINE: Preg Test, Ur: NEGATIVE

## 2015-11-19 SURGERY — HYSTERECTOMY, TOTAL, LAPAROSCOPIC
Anesthesia: General

## 2015-11-19 MED ORDER — ONDANSETRON HCL 4 MG/2ML IJ SOLN: 4.0000 mg | Freq: Four times a day (QID) | INTRAMUSCULAR | Status: DC | PRN

## 2015-11-19 MED ORDER — ONDANSETRON HCL 4 MG/2ML IJ SOLN
INTRAMUSCULAR | Status: DC | PRN
  Administered 2015-11-19: 4 mg via INTRAVENOUS

## 2015-11-19 MED ORDER — BUPIVACAINE HCL 0.5 % IJ SOLN
INTRAMUSCULAR | Status: DC | PRN
Start: 2015-11-19 — End: 2015-11-19
  Administered 2015-11-19: 10 mL

## 2015-11-19 MED ORDER — GLYCOPYRROLATE 0.2 MG/ML IJ SOLN
INTRAMUSCULAR | Status: DC | PRN
  Administered 2015-11-19: 0.2 mg via INTRAVENOUS

## 2015-11-19 MED ORDER — KETOROLAC TROMETHAMINE 30 MG/ML IJ SOLN
INTRAMUSCULAR | Status: DC | PRN
  Administered 2015-11-19: 30 mg via INTRAVENOUS

## 2015-11-19 MED ORDER — LACTATED RINGERS IV SOLN
INTRAVENOUS | Status: DC
  Administered 2015-11-19 – 2015-11-20 (×3): via INTRAVENOUS

## 2015-11-19 MED ORDER — OXYCODONE-ACETAMINOPHEN 5-325 MG PO TABS
1.0000 | ORAL_TABLET | ORAL | Status: DC | PRN
  Administered 2015-11-19: 2 via ORAL
  Administered 2015-11-19: 1 via ORAL
  Administered 2015-11-20: 2 via ORAL
  Administered 2015-11-20: 1 via ORAL
  Filled 2015-11-19 (×3): qty 1
  Filled 2015-11-19: qty 2
  Filled 2015-11-19: qty 1

## 2015-11-19 MED ORDER — LACTATED RINGERS IR SOLN
Status: DC | PRN
  Administered 2015-11-19: 300 mL

## 2015-11-19 MED ORDER — LACTATED RINGERS IV SOLN
INTRAVENOUS | Status: DC
Start: 2015-11-19 — End: 2015-11-19
  Administered 2015-11-19 (×2): via INTRAVENOUS

## 2015-11-19 MED ORDER — SUGAMMADEX SODIUM 200 MG/2ML IV SOLN
INTRAVENOUS | Status: DC | PRN
  Administered 2015-11-19: 145.2 mg via INTRAVENOUS

## 2015-11-19 MED ORDER — HYDROMORPHONE HCL 1 MG/ML IJ SOLN
1.0000 mg | INTRAMUSCULAR | Status: DC | PRN
  Administered 2015-11-19: 1 mg via INTRAVENOUS
  Filled 2015-11-19: qty 1

## 2015-11-19 MED ORDER — CEFAZOLIN SODIUM-DEXTROSE 2-3 GM-% IV SOLR
2.0000 g | INTRAVENOUS | Status: AC
  Administered 2015-11-19: 2 g via INTRAVENOUS

## 2015-11-19 MED ORDER — BUPIVACAINE HCL (PF) 0.5 % IJ SOLN
INTRAMUSCULAR | Status: AC
  Filled 2015-11-19: qty 30

## 2015-11-19 MED ORDER — ONDANSETRON HCL 4 MG PO TABS: 4.0000 mg | ORAL_TABLET | Freq: Four times a day (QID) | ORAL | Status: DC | PRN

## 2015-11-19 MED ORDER — SIMETHICONE 80 MG PO CHEW: 80.0000 mg | CHEWABLE_TABLET | Freq: Four times a day (QID) | ORAL | Status: DC | PRN

## 2015-11-19 MED ORDER — DOCUSATE SODIUM 100 MG PO CAPS
100.0000 mg | ORAL_CAPSULE | Freq: Two times a day (BID) | ORAL | Status: DC
  Administered 2015-11-19 – 2015-11-20 (×2): 100 mg via ORAL
  Filled 2015-11-19 (×2): qty 1

## 2015-11-19 MED ORDER — IBUPROFEN 600 MG PO TABS
600.0000 mg | ORAL_TABLET | Freq: Four times a day (QID) | ORAL | Status: DC | PRN
  Administered 2015-11-19 – 2015-11-20 (×2): 600 mg via ORAL
  Filled 2015-11-19 (×2): qty 1

## 2015-11-19 MED ORDER — ACETAMINOPHEN 10 MG/ML IV SOLN
INTRAVENOUS | Status: AC
  Filled 2015-11-19: qty 100

## 2015-11-19 MED ORDER — FENTANYL CITRATE (PF) 100 MCG/2ML IJ SOLN
INTRAMUSCULAR | Status: DC | PRN
  Administered 2015-11-19: 25 ug via INTRAVENOUS
  Administered 2015-11-19 (×4): 50 ug via INTRAVENOUS
  Administered 2015-11-19: 25 ug via INTRAVENOUS

## 2015-11-19 MED ORDER — MIDAZOLAM HCL 2 MG/2ML IJ SOLN
INTRAMUSCULAR | Status: DC | PRN
  Administered 2015-11-19: 2 mg via INTRAVENOUS

## 2015-11-19 MED ORDER — CEFAZOLIN SODIUM-DEXTROSE 2-3 GM-% IV SOLR
INTRAVENOUS | Status: AC
  Filled 2015-11-19: qty 50

## 2015-11-19 MED ORDER — PROPOFOL 10 MG/ML IV BOLUS
INTRAVENOUS | Status: DC | PRN
  Administered 2015-11-19: 150 mg via INTRAVENOUS

## 2015-11-19 MED ORDER — DEXAMETHASONE SODIUM PHOSPHATE 10 MG/ML IJ SOLN
INTRAMUSCULAR | Status: DC | PRN
  Administered 2015-11-19: 10 mg via INTRAVENOUS

## 2015-11-19 MED ORDER — FENTANYL CITRATE (PF) 100 MCG/2ML IJ SOLN
INTRAMUSCULAR | Status: AC
  Administered 2015-11-19: 25 ug via INTRAVENOUS
  Filled 2015-11-19: qty 2

## 2015-11-19 MED ORDER — MENTHOL 3 MG MT LOZG: 1.0000 | LOZENGE | OROMUCOSAL | Status: DC | PRN

## 2015-11-19 MED ORDER — LIDOCAINE HCL (CARDIAC) 20 MG/ML IV SOLN
INTRAVENOUS | Status: DC | PRN
  Administered 2015-11-19: 60 mg via INTRAVENOUS

## 2015-11-19 MED ORDER — ACETAMINOPHEN 10 MG/ML IV SOLN
INTRAVENOUS | Status: DC | PRN
  Administered 2015-11-19: 1000 mg via INTRAVENOUS

## 2015-11-19 MED ORDER — FENTANYL CITRATE (PF) 100 MCG/2ML IJ SOLN
25.0000 ug | INTRAMUSCULAR | Status: DC | PRN
  Administered 2015-11-19 (×2): 25 ug via INTRAVENOUS

## 2015-11-19 MED ORDER — ROCURONIUM BROMIDE 100 MG/10ML IV SOLN
INTRAVENOUS | Status: DC | PRN
  Administered 2015-11-19 (×3): 5 mg via INTRAVENOUS
  Administered 2015-11-19: 40 mg via INTRAVENOUS
  Administered 2015-11-19: 5 mg via INTRAVENOUS

## 2015-11-19 MED ORDER — ONDANSETRON HCL 4 MG/2ML IJ SOLN: 4.0000 mg | Freq: Once | INTRAMUSCULAR | Status: DC | PRN

## 2015-11-19 SURGICAL SUPPLY — 56 items
BAG URO DRAIN 2000ML W/SPOUT (MISCELLANEOUS) ×2 IMPLANT
BLADE SURG SZ11 CARB STEEL (BLADE) ×2 IMPLANT
CATH FOLEY 2WAY  5CC 16FR (CATHETERS) ×1
CATH ROBINSON RED A/P 16FR (CATHETERS) ×2 IMPLANT
CATH URTH 16FR FL 2W BLN LF (CATHETERS) ×1 IMPLANT
CHLORAPREP W/TINT 26ML (MISCELLANEOUS) ×2 IMPLANT
COUNTER NEEDLE 20/40 LG (NEEDLE) ×2 IMPLANT
DEFOGGER SCOPE WARMER CLEARIFY (MISCELLANEOUS) ×2 IMPLANT
DEVICE SUTURE ENDOST 10MM (ENDOMECHANICALS) ×6 IMPLANT
DEVICE TROCAR PUNCTURE CLOSURE (ENDOMECHANICALS) ×2 IMPLANT
DRAPE LEGGINS SURG 28X43 STRL (DRAPES) ×2 IMPLANT
DRAPE SHEET LG 3/4 BI-LAMINATE (DRAPES) ×2 IMPLANT
DRAPE UNDER BUTTOCK W/FLU (DRAPES) ×2 IMPLANT
ENDOSTITCH 0 SINGLE 48 (SUTURE) ×2 IMPLANT
GLOVE BIO SURGEON STRL SZ7 (GLOVE) ×22 IMPLANT
GLOVE BIOGEL PI IND STRL 7.5 (GLOVE) ×1 IMPLANT
GLOVE BIOGEL PI INDICATOR 7.5 (GLOVE) ×1
GLOVE INDICATOR 7.5 STRL GRN (GLOVE) ×2 IMPLANT
GOWN STRL REUS W/ TWL LRG LVL3 (GOWN DISPOSABLE) ×3 IMPLANT
GOWN STRL REUS W/ TWL XL LVL3 (GOWN DISPOSABLE) ×1 IMPLANT
GOWN STRL REUS W/TWL LRG LVL3 (GOWN DISPOSABLE) ×3
GOWN STRL REUS W/TWL XL LVL3 (GOWN DISPOSABLE) ×1
GRASPER SUT TROCAR 14GX15 (MISCELLANEOUS) ×2 IMPLANT
IRRIGATION STRYKERFLOW (MISCELLANEOUS) ×1 IMPLANT
IRRIGATOR STRYKERFLOW (MISCELLANEOUS) ×2
IV LACTATED RINGERS 1000ML (IV SOLUTION) ×4 IMPLANT
KIT RM TURNOVER CYSTO AR (KITS) ×2 IMPLANT
LABEL OR SOLS (LABEL) ×2 IMPLANT
LIGASURE BLUNT 5MM 37CM (INSTRUMENTS) ×2 IMPLANT
LIQUID BAND (GAUZE/BANDAGES/DRESSINGS) ×2 IMPLANT
MANIPULATOR VCARE LG CRV RETR (MISCELLANEOUS) IMPLANT
MANIPULATOR VCARE STD CRV RETR (MISCELLANEOUS) ×2 IMPLANT
NDL SAFETY 22GX1.5 (NEEDLE) ×2 IMPLANT
OCCLUDER COLPOPNEUMO (BALLOONS) ×2 IMPLANT
PACK LAP CHOLECYSTECTOMY (MISCELLANEOUS) ×2 IMPLANT
PAD OB MATERNITY 4.3X12.25 (PERSONAL CARE ITEMS) ×2 IMPLANT
PAD PREP 24X41 OB/GYN DISP (PERSONAL CARE ITEMS) ×2 IMPLANT
SCISSORS METZENBAUM CVD 33 (INSTRUMENTS) ×2 IMPLANT
SET CYSTO W/LG BORE CLAMP LF (SET/KITS/TRAYS/PACK) ×2 IMPLANT
SLEEVE ENDOPATH XCEL 5M (ENDOMECHANICALS) ×2 IMPLANT
SOL PREP PVP 2OZ (MISCELLANEOUS) ×2
SOLUTION ELECTROLUBE (MISCELLANEOUS) ×2 IMPLANT
SOLUTION PREP PVP 2OZ (MISCELLANEOUS) ×1 IMPLANT
SPONGE LAP 18X18 5 PK (GAUZE/BANDAGES/DRESSINGS) ×2 IMPLANT
SPONGE XRAY 4X4 16PLY STRL (MISCELLANEOUS) IMPLANT
SURGILUBE 2OZ TUBE FLIPTOP (MISCELLANEOUS) ×2 IMPLANT
SUT DVC VLOC 180 0 12IN GS21 (SUTURE) ×6
SUT ENDO VLOC 180-0-8IN (SUTURE) ×2 IMPLANT
SUT VIC AB 0 CT1 36 (SUTURE) ×12 IMPLANT
SUTURE DVC VLC 180 0 12IN GS21 (SUTURE) ×3 IMPLANT
SYR 50ML LL SCALE MARK (SYRINGE) ×2 IMPLANT
SYRINGE 10CC LL (SYRINGE) ×4 IMPLANT
TROCAR ENDO BLADELESS 11MM (ENDOMECHANICALS) ×2 IMPLANT
TROCAR XCEL NON-BLD 5MMX100MML (ENDOMECHANICALS) ×2 IMPLANT
TUBING INSUFFLATOR HEATED (MISCELLANEOUS) ×2 IMPLANT
WATER STERILE IRR 3000ML UROMA (IV SOLUTION) ×2 IMPLANT
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
## 2015-11-19 NOTE — Transfer of Care (Signed)
Immediate Anesthesia Transfer of Care Note  Patient: Lauris PoagSharon O [place]  Procedure(s) Performed: Procedure(s): HYSTERECTOMY TOTAL LAPAROSCOPIC/BILATERAL SALPINGECTOMY (N/A) CYSTOSCOPY (N/A)  Patient Location: PACU  Anesthesia Type:General  Level of Consciousness: awake and alert   Airway & Oxygen Therapy: Patient Spontanous Breathing and Patient connected to face mask oxygen  Post-op Assessment: Report given to RN and Post -op Vital signs reviewed and stable  Post vital signs: Reviewed and stable  Last Vitals:  Filed Vitals:   11/19/15 0923 11/19/15 1332  BP: 116/69 120/90  Pulse: 66 97  Temp: 36.8 C 37.2 C  Resp: 16 17    Complications: No apparent anesthesia complications

## 2015-11-19 NOTE — Progress Notes (Signed)
Post-operative progress note  Subjective: Patient has ambulated once to bathroom with help, has voided once, has tolerated a small amount of PO, and she has started taking PO pain medication.  She denies chest pain, trouble breathing, current nausea and vomiting.    Objective:  BP 104/66 mmHg  Pulse 72  Temp(Src) 98.3 F (36.8 C) (Oral)  Resp 20  Ht 5\' 2"  (1.575 m)  Wt 160 lb (72.576 kg)  BMI 29.26 kg/m2  SpO2 96%  LMP 11/09/2015  GEN: NAD CV: RRR Pulm: CTAB ABD; s/nt/mild distention/scant bs/no rebound or guarding Incisions: clean/dry/intact Ext: no e/c/t  A/P: POD#0 s/p TLH/BS and cystoscopy for menorrhagia with irregular periods and dysmenorrhea Continue current plan of care. Plan for discharge in AM.  Thomasene MohairStephen Yazlyn Wentzel, MD 11/19/2015 9:04 PM

## 2015-11-19 NOTE — Anesthesia Postprocedure Evaluation (Signed)
Anesthesia Post Note  Patient: Lauris PoagSharon O [place]  Procedure(s) Performed: Procedure(s) (LRB): HYSTERECTOMY TOTAL LAPAROSCOPIC/BILATERAL SALPINGECTOMY (N/A) CYSTOSCOPY (N/A)  Patient location during evaluation: PACU Anesthesia Type: General Level of consciousness: awake Pain management: satisfactory to patient Vital Signs Assessment: post-procedure vital signs reviewed and stable Respiratory status: nonlabored ventilation Cardiovascular status: stable Anesthetic complications: no    Last Vitals:  Filed Vitals:   11/19/15 1345 11/19/15 1355  BP:    Pulse: 76 79  Temp:    Resp: 19 13    Last Pain:  Filed Vitals:   11/19/15 1356  PainSc: 4                  VAN STAVEREN,Tamiko Leopard

## 2015-11-19 NOTE — Anesthesia Preprocedure Evaluation (Signed)
Anesthesia Evaluation  Patient identified by MRN, date of birth, ID band Patient awake    Reviewed: Allergy & Precautions, NPO status , Patient's Chart, lab work & pertinent test results, reviewed documented beta blocker date and time   History of Anesthesia Complications (+) PONV and history of anesthetic complications  Airway Mallampati: II  TM Distance: >3 FB     Dental  (+) Chipped   Pulmonary former smoker,           Cardiovascular      Neuro/Psych    GI/Hepatic   Endo/Other    Renal/GU      Musculoskeletal   Abdominal   Peds  Hematology   Anesthesia Other Findings   Reproductive/Obstetrics                             Anesthesia Physical Anesthesia Plan  ASA: II  Anesthesia Plan: General   Post-op Pain Management:    Induction: Intravenous  Airway Management Planned: Oral ETT  Additional Equipment:   Intra-op Plan:   Post-operative Plan:   Informed Consent: I have reviewed the patients History and Physical, chart, labs and discussed the procedure including the risks, benefits and alternatives for the proposed anesthesia with the patient or authorized representative who has indicated his/her understanding and acceptance.     Plan Discussed with: CRNA  Anesthesia Plan Comments:         Anesthesia Quick Evaluation

## 2015-11-19 NOTE — Op Note (Signed)
Op Note Total Laparoscopic Hysterectomy  Pre-Op Diagnosis:  1) menorrhagia with irregular cycles 2) dysmenorrhea 3) failed medical and conservative surgical therapy  Post-Op Diagnosis:  1) menorrhagia with irregular cycles 2) dysmenorrhea 3) failed medical and conservative surgical therapy  Procedures:  1. Total laparoscopic hysterectomy 2. Bilateral salpingectomy 3. Cystoscopy  Primary Surgeon: Thomasene Mohair, MD   Assistant Surgeon: Ranae Plumber, MD  EBL: 75 ml   IVF: 1,100 mL   Urine output: 600 mL  Specimens: Uterus, cervix, bilateral fallopian tubes.  Drains: None  Complications: None   Disposition: PACU   Condition: Stable   Findings:  1) Enlarged uterus with normal-appearing ovaries 2) multiple paratubal cysts bilaterally, otherwise normal-appearing fallopian tubes status post tubal ligation  Indication: The patient is a 49 year old female who has a long history of abnormal heavy and irregular periods associated with severe pain. She is undergone hysteroscopy and polypectomy in one surgery. Subsequently, due to the fact that her bleeding was not well-controlled, she underwent an endometrial ablation. After a year of continuing to have heavy menses she elected for definitive management.  Procedure Summary:  The patient was taken to the operating room where general anesthesia was administered and found to be adequate. She was placed in the dorsal supine lithotomy position in [place] stirrups and prepped and draped in usual sterile fashion. Great care was taken to position the patient such that all nerves and blood vessels would be protected.  After a timeout was called, an indwelling catheter was placed in her bladder. A sterile speculum was placed in the vagina and a single-tooth tenaculum was used to grasp the anterior lip of the cervix. A V-Care uterine manipulator was affixed to the uterus in accordance to the manufacturers recommendations. Additionally, the V  care device was attached to the cervix using 4 interrupted sutures. The speculum and tenaculum was removed from the vagina.  Attention was turned to the abdomen where, after injection of local anesthetic, a 5 mm infraumbilical incision was made with the scalpel. Entry into the abdomen was obtained via Optiview trocar technique (a blunt entry technique with camera visualization through the obturator upon entry). Verification of entry into the abdomen was obtained using opening pressures. The abdomen was insufflated with CO2. The camera was introduced through the trocar with verification of atraumatic entry. A left lower quadrant 5 mm port was created via direct intra-abdominal camera visualization without difficulty. An 11 mm right lower quadrant port was placed in a similar fashion without difficulty.  After inspection of the abdomen and pelvis with the above-noted findings, the bilateral ureters were identified and found to be well away from the operative area of interest. The left fallopian tube was grasped at the fimbriated end and was transected using the LigaSure along the mesosalpinx in a lateral to medial fashion. The LigaSure then was used to transect the left round ligament and the utero-ovarian ligament was transected. Tissue was divided along the left broad ligament to the level of the anterior cervical os. Scar tissue and bladder tissue were dissected off the lower uterine segment and cervix without difficulty. The left uterine artery was skeletonized and identified and after ligation was transected with the LigaSure device. The same procedure was carried out on the right side. The colpotomy was performed using monopolar electrocautery in a circumferential fashion following the KOH ring.  The vaginal occluder balloon was insufflated prior to colpotomy to prevent loss of pneumoperitoneum. The uterus and fallopian tubes and cervix were removed through the vagina.  Attention was returned to the pelvis  and closure of the vaginal cuff was undertaken using the V-lock stitch in a running, vertical  fashion. A gloved hand was placed in the vagina to assess adequate closure of the vaginal cuff. This was found to be satisfactory. All vascular pedicles were inspected and found to be hemostatic. Copious irrigation was undertaken and hemostasis was again verified. The right lower quadrant trocar was removed and the fascia was reapproximated using #0 Vicryl with a single stitch. The abdomen was then desufflated of CO2 after removal of all instruments. Five deep breaths were given by anesthesia to the patient to help with removal of CO2 from the abdomen. The right lower quadrant skin incision was closed using 4-0 Vicryl in a subcuticular fashion. The remaining skin incisions were closed using surgical skin glue and a layer of surgical skin glue was placed over the right lower quadrant skin incision, as well. The catheter was then removed from the bladder. The vagina was inspected and found to be free of instrumentation and sponges.  Cystoscopy was undertaken at this point. The Foley catheter was removed and the 70 cystoscope was gently introduced through the urethra. The bladder survey was undertaken with efflux of urine from both orifices noted. There were no defects noted in the bladder wall.   Sponge, lap, needle, and instrument counts were correct x 2.  VTE prophylaxis pneumatic compression stockings were in place throughout the entire procedure. Antibiotic prophylaxis Ancef 2 g given within 1 hour of skin incision.The patient was then awakened and taken to the recovery room in stable condition.   Thomasene MohairStephen Alazar Cherian, MD 11/19/2015 1:21 PM

## 2015-11-20 ENCOUNTER — Encounter: Payer: Self-pay | Admitting: Obstetrics and Gynecology

## 2015-11-20 DIAGNOSIS — N800 Endometriosis of uterus: Secondary | ICD-10-CM | POA: Diagnosis not present

## 2015-11-20 LAB — CBC
HEMATOCRIT: 38.2 % (ref 35.0–47.0)
HEMOGLOBIN: 13.2 g/dL (ref 12.0–16.0)
MCH: 31.4 pg (ref 26.0–34.0)
MCHC: 34.5 g/dL (ref 32.0–36.0)
MCV: 91 fL (ref 80.0–100.0)
Platelets: 148 10*3/uL — ABNORMAL LOW (ref 150–440)
RBC: 4.2 MIL/uL (ref 3.80–5.20)
RDW: 13.4 % (ref 11.5–14.5)
WBC: 9.8 10*3/uL (ref 3.6–11.0)

## 2015-11-20 LAB — BASIC METABOLIC PANEL
ANION GAP: 4 — AB (ref 5–15)
BUN: 9 mg/dL (ref 6–20)
CHLORIDE: 110 mmol/L (ref 101–111)
CO2: 24 mmol/L (ref 22–32)
Calcium: 8.4 mg/dL — ABNORMAL LOW (ref 8.9–10.3)
Creatinine, Ser: 0.67 mg/dL (ref 0.44–1.00)
GFR calc non Af Amer: >60 mL/min (ref >60)
Glucose, Bld: 109 mg/dL — ABNORMAL HIGH (ref 65–99)
Potassium: 3.8 mmol/L (ref 3.5–5.1)
Sodium: 138 mmol/L (ref 135–145)

## 2015-11-20 LAB — SURGICAL PATHOLOGY

## 2015-11-20 MED ORDER — IBUPROFEN 600 MG PO TABS: 600.0000 mg | ORAL_TABLET | Freq: Four times a day (QID) | ORAL | Status: DC | PRN

## 2015-11-20 MED ORDER — OXYCODONE-ACETAMINOPHEN 5-325 MG PO TABS: 1.0000 | ORAL_TABLET | ORAL | Status: DC | PRN

## 2015-11-20 NOTE — Discharge Summary (Signed)
DC Summary Discharge Summary   Patient ID: Christina PoagSharon O Bates [id number] 49 y.o. April 24, 1966  Admit date: 11/19/2015  Discharge date: 11/20/2015  Principal Diagnoses:  Patient Active Problem List   Diagnosis Date Noted  . Menorrhagia with irregular cycle 11/19/2015  . Dysmenorrhea 11/19/2015  . Status post laparoscopic hysterectomy 11/19/2015   Secondary Diagnoses:  None  Procedures performed during the hospitalization:  Total laparoscopic hysterectomy, bilateral salpingectomy, cystoscopy (11/19/15)  HPI: 49 y.o. female admitted for above surgery for menorrhagia and dysmenorrhea after failing medical and conservative surgical therapy.   Past Medical History  Diagnosis Date  . Complication of anesthesia   . PONV (postoperative nausea and vomiting)   . RLS (restless legs syndrome)     Occurs on occasion, better now.  Marland Kitchen. HSV infection   . Hearing loss of both ears    Past Surgical History  Procedure Laterality Date  . Cochlear implant Left 2004  . Cesarean section  1992 & 1993  . Dilation and curettage of uterus    . Endometrial ablation    . Foot surgery Left 2011  . Foot surgery Right 2008  . Tubal ligation    . Hysteroscopy    . Tonsillectomy  1972   Allergies  Allergen Reactions  . Bacitracin Itching and Rash   Social History  Substance Use Topics  . Smoking status: Former Smoker    Quit date: 11/12/2011  . Smokeless tobacco: None  . Alcohol Use: 0.0 oz/week    0 Standard drinks or equivalent per week     Comment: 3-5 drinks per year    History reviewed. No pertinent family history.  Hospital Course:  Admitted for the above surgery, which occurred without complication. Patient had routine post-operative course, meeting discharge criteria on post-op day #1. She was ambulating, tolerating PO diet, voiding spontaneously, and her pain was well controlled on PO pain meds.  Discharge Exam: BP 103/64 mmHg  Pulse 83  Temp(Src) 98.3 F (36.8 C) (Oral)   Resp 20  Ht 5\' 2"  (1.575 m)  Wt 160 lb (72.576 kg)  BMI 29.26 kg/m2  SpO2 94%  LMP 11/09/2015 General  no apparent distress   CV  RRR   Pulmonary  clear to ausculatation bllaterally   Abdomen  Bowel sounds: present  Incisions: clean, dry, intact  Extremities  no edema, symmetric, SCDs in place    Condition at Discharge: Stable  Complications affecting treatment: None  Discharge Medications:    Medication List    TAKE these medications        cholecalciferol 1000 units tablet  Commonly known as:  VITAMIN D  Take 1,000 Units by mouth daily.     ferrous sulfate 325 (65 FE) MG tablet  Take 325 mg by mouth every other day.     ibuprofen 600 MG tablet  Commonly known as:  ADVIL,MOTRIN  Take 1 tablet (600 mg total) by mouth every 6 (six) hours as needed (mild pain).     oxyCODONE-acetaminophen 5-325 MG tablet  Commonly known as:  PERCOCET/ROXICET  Take 1-2 tablets by mouth every 4 (four) hours as needed (moderate (1 pill with pain score 4-7/10)  to severe pain (2 pills, pain score >7/10)).     pantoprazole 40 MG tablet  Commonly known as:  PROTONIX  40 mg daily.     valACYclovir 1000 MG tablet  Commonly known as:  VALTREX  Take 1,000 mg by mouth every other day.        Follow-up arrangements:  Follow-up Information    Follow up with Conard Novak, MD. Schedule an appointment as soon as possible for a visit in 1 week.   Specialty:  Obstetrics and Gynecology   Why:  incision check   Contact information:   [address] [place] [address] [phone]      Discharge Disposition: Discharge home  Signed: Conard Novak, MD 11/20/2015 7:09 AM

## 2015-11-20 NOTE — Progress Notes (Signed)
Patient discharged home with spouse. Discharge instructions, prescriptions and follow up appointment given to and reviewed with patient and spouse. Patient verbalized understanding. Escorted out via wheelchair by auxillary. 

## 2015-12-31 DIAGNOSIS — M542 Cervicalgia: Secondary | ICD-10-CM | POA: Insufficient documentation

## 2015-12-31 DIAGNOSIS — F419 Anxiety disorder, unspecified: Secondary | ICD-10-CM | POA: Insufficient documentation

## 2015-12-31 DIAGNOSIS — M25552 Pain in left hip: Secondary | ICD-10-CM | POA: Insufficient documentation

## 2016-03-24 DIAGNOSIS — E7800 Pure hypercholesterolemia, unspecified: Secondary | ICD-10-CM | POA: Insufficient documentation

## 2016-08-19 ENCOUNTER — Other Ambulatory Visit: Payer: Self-pay | Admitting: Obstetrics and Gynecology

## 2016-08-19 DIAGNOSIS — Z1231 Encounter for screening mammogram for malignant neoplasm of breast: Secondary | ICD-10-CM

## 2016-08-24 ENCOUNTER — Encounter: Payer: Self-pay | Admitting: Urology

## 2016-08-24 ENCOUNTER — Ambulatory Visit (INDEPENDENT_AMBULATORY_CARE_PROVIDER_SITE_OTHER): Payer: 59 | Admitting: Urology

## 2016-08-24 VITALS — BP 94/62 | HR 67 | Temp 98.0°F | Ht 63.0 in | Wt 156.2 lb

## 2016-08-24 DIAGNOSIS — N393 Stress incontinence (female) (male): Secondary | ICD-10-CM | POA: Diagnosis not present

## 2016-08-24 DIAGNOSIS — N390 Urinary tract infection, site not specified: Secondary | ICD-10-CM

## 2016-08-24 DIAGNOSIS — N952 Postmenopausal atrophic vaginitis: Secondary | ICD-10-CM

## 2016-08-24 LAB — MICROSCOPIC EXAMINATION

## 2016-08-24 LAB — URINALYSIS, COMPLETE
Bilirubin, UA: NEGATIVE
GLUCOSE, UA: NEGATIVE
KETONES UA: NEGATIVE
Leukocytes, UA: NEGATIVE
NITRITE UA: NEGATIVE
Protein, UA: NEGATIVE
RBC, UA: NEGATIVE
UUROB: 1 mg/dL (ref 0.2–1.0)
pH, UA: 5 (ref 5.0–7.5)

## 2016-08-24 LAB — BLADDER SCAN AMB NON-IMAGING: SCAN RESULT: 21

## 2016-08-24 MED ORDER — ESTROGENS, CONJUGATED 0.625 MG/GM VA CREA: 1.0000 | TOPICAL_CREAM | Freq: Every day | VAGINAL | 12 refills | Status: DC

## 2016-08-24 MED ORDER — ESTRADIOL 0.1 MG/GM VA CREA: TOPICAL_CREAM | VAGINAL | 12 refills | Status: DC

## 2016-08-24 MED ORDER — CEPHALEXIN 250 MG PO CAPS: ORAL_CAPSULE | ORAL | 0 refills | Status: DC

## 2016-08-24 NOTE — Patient Instructions (Signed)

## 2016-08-24 NOTE — Progress Notes (Signed)
08/24/2016 2:54 PM   Christina Bates [birth year] [id number]  Referring provider: Leotis Shames, MD [address] [facility] [place], [place] [address]  Chief Complaint  Patient presents with  . Recurrent UTI    new patient referred by Dr. Thedore Mins    HPI: Patient is a 50 -year-old Caucasian female who is referred to [place] by, Dr. Thedore Mins, for recurrent urinary tract infections.  Patient states that she has had 4 to 5 urinary tract infections over the last year.  Her symptoms with a urinary tract infection consist of dysuria, urgency and suprapubic pain.    She denies gross hematuria, back pain, abdominal pain or flank pain.  She has not had any recent fevers, chills, nausea or vomiting.  She does not have a history of nephrolithiasis, GU surgery or GU trauma.     Reviewing her records,  she has had two documented infections for E.coli that was pan-sensitive.    She is sexually active.  She has noted a correlation with her urinary tract infections and sexual intercourse.   She is peri menopausal.   She does not engage in anal sex.   She is not voiding before sex, but she is voiding after sex.   She denies constipation and/or diarrhea.   She does engage in good perineal hygiene. She does not take tub baths, but she does shave her legs in the bath tub.    She does have incontinence.  She is using incontinence pads (panty liners).  Two daily.    She is not having pain with bladder filling.    She has not had any recent imaging studies.    She is drinking limited of water daily.    Her IPSS score is 9/5.  Her PVR today was 21 mL.  She states she is getting up 4 times nightly to urinate.  Her UA was unremarkable.        IPSS    Row Name 08/24/16 1400         International Prostate Symptom Score   How often have you had the sensation of not emptying your bladder? Not at All     How often have you had to urinate less than every two hours? Less than 1 in 5  times     How often have you found you stopped and started again several times when you urinated? Not at All     How often have you found it difficult to postpone urination? Less than half the time     How often have you had a weak urinary stream? Less than half the time     How often have you had to strain to start urination? Not at All     How many times did you typically get up at night to urinate? 4 Times     Total IPSS Score 9       Quality of Life due to urinary symptoms   If you were to spend the rest of your life with your urinary condition just the way it is now how would you feel about that? Unhappy        Score:  1-7 Mild 8-19 Moderate 20-35 Severe   PMH: Past Medical History:  Diagnosis Date  . Anxiety   . Complication of anesthesia   . Hearing loss of both ears   . Heartburn   . HLD (hyperlipidemia)   . HSV infection   . PONV (postoperative nausea and  vomiting)   . RLS (restless legs syndrome)    Occurs on occasion, better now.    Surgical History: Past Surgical History:  Procedure Laterality Date  . CESAREAN SECTION  1992 & 1993  . COCHLEAR IMPLANT Left 2004  . CYSTOSCOPY N/A 11/19/2015   Procedure: CYSTOSCOPY;  Surgeon: Conard Novak, MD;  Location: [facility] ORS;  Service: Gynecology;  Laterality: N/A;  . DILATION AND CURETTAGE OF UTERUS    . ENDOMETRIAL ABLATION    . FOOT SURGERY Left 2011  . FOOT SURGERY Right 2008  . HYSTEROSCOPY    . LAPAROSCOPIC HYSTERECTOMY N/A 11/19/2015   Procedure: HYSTERECTOMY TOTAL LAPAROSCOPIC/BILATERAL SALPINGECTOMY;  Surgeon: Conard Novak, MD;  Location: [facility] ORS;  Service: Gynecology;  Laterality: N/A;  . TONSILLECTOMY  1972  . TUBAL LIGATION      Home Medications:    Medication List       Accurate as of 08/24/16  2:54 PM. Always use your most recent med list.          ALPRAZolam 0.25 MG tablet Commonly known as:  XANAX   cephALEXin 250 MG capsule Commonly known as:  KEFLEX Take one tablet after  intercourse   cholecalciferol 1000 units tablet Commonly known as:  VITAMIN D Take 1,000 Units by mouth daily.   conjugated estrogens vaginal cream Commonly known as:  PREMARIN Place 1 Applicatorful vaginally daily. Apply 0.5mg  (pea-sized amount)  just inside the vaginal introitus with a finger-tip every night for two weeks and then Monday, Wednesday and Friday nights.   estradiol 0.1 MG/GM vaginal cream Commonly known as:  ESTRACE VAGINAL Apply 0.5mg  (pea-sized amount)  just inside the vaginal introitus with a finger-tip every night for two weeks and then Monday, Wednesday and Friday nights.   ferrous sulfate 325 (65 FE) MG tablet Take 325 mg by mouth every other day.   ibuprofen 600 MG tablet Commonly known as:  ADVIL,MOTRIN Take 1 tablet (600 mg total) by mouth every 6 (six) hours as needed (mild pain).   methocarbamol 750 MG tablet Commonly known as:  ROBAXIN TK 1 T PO TID. DISCONTINUE ZANAFLEX   metroNIDAZOLE 0.75 % cream Commonly known as:  METROCREAM Apply topically.   oxyCODONE-acetaminophen 5-325 MG tablet Commonly known as:  PERCOCET/ROXICET Take 1-2 tablets by mouth every 4 (four) hours as needed (moderate (1 pill with pain score 4-7/10)  to severe pain (2 pills, pain score >7/10)).   pantoprazole 40 MG tablet Commonly known as:  PROTONIX 40 mg daily.   RA VITAMIN B-12 TR 1000 MCG Tbcr Generic drug:  Cyanocobalamin Take by mouth.   tiZANidine 4 MG tablet Commonly known as:  ZANAFLEX Take 4 mg by mouth.   traMADol-acetaminophen 37.5-325 MG tablet Commonly known as:  ULTRACET TAKE 1 TABLET BY MOUTH EVERY 8 HOURS AS NEEDED FOR PAIN  FOR UP TO 10 DAYS   valACYclovir 1000 MG tablet Commonly known as:  VALTREX Take 1,000 mg by mouth every other day.       Allergies:  Allergies  Allergen Reactions  . Bee Venom   . Bacitracin Itching and Rash    Family History: Family History  Problem Relation Age of Onset  . Kidney disease Neg Hx   . Bladder  Cancer Neg Hx     Social History:  reports that she quit smoking about 4 years ago. She has never used smokeless tobacco. She reports that she drinks alcohol. She reports that she does not use drugs.  ROS: UROLOGY Frequent Urination?: Yes  Hard to postpone urination?: No Burning/pain with urination?: Yes Get up at night to urinate?: Yes Leakage of urine?: Yes Urine stream starts and stops?: No Trouble starting stream?: No Do you have to strain to urinate?: No Blood in urine?: No Urinary tract infection?: Yes Sexually transmitted disease?: No Injury to kidneys or bladder?: No Painful intercourse?: No Weak stream?: No Currently pregnant?: No Vaginal bleeding?: No Last menstrual period?: n  Gastrointestinal Nausea?: No Vomiting?: No Indigestion/heartburn?: No Diarrhea?: No Constipation?: No  Constitutional Fever: No Night sweats?: No Weight loss?: No Fatigue?: Yes  Skin Skin rash/lesions?: No Itching?: No  Eyes Blurred vision?: No Double vision?: No  Ears/Nose/Throat Sore throat?: No Sinus problems?: No  Hematologic/Lymphatic Swollen glands?: No Easy bruising?: No  Cardiovascular Leg swelling?: No Chest pain?: No  Respiratory Cough?: No Shortness of breath?: No  Endocrine Excessive thirst?: No  Musculoskeletal Back pain?: Yes Joint pain?: No  Neurological Headaches?: No Dizziness?: No  Psychologic Depression?: No Anxiety?: Yes  Physical Exam: BP 94/62   Pulse 67   Temp 98 F (36.7 C) (Oral)   Ht 5\' 3"  (1.6 m)   Wt 156 lb 3.2 oz (70.9 kg)   LMP 11/09/2015   BMI 27.67 kg/m   Constitutional: Well nourished. Alert and oriented, No acute distress. HEENT: [place] AT, moist mucus membranes. Trachea midline, no masses. Cardiovascular: No clubbing, cyanosis, or edema. Respiratory: Normal respiratory effort, no increased work of breathing. GI: Abdomen is soft, non tender, non distended, no abdominal masses. Liver and spleen not palpable.  No  hernias appreciated.  Stool sample for occult testing is not indicated.   GU: No CVA tenderness.  No bladder fullness or masses.  Atrophic external genitalia, normal pubic hair distribution, no lesions.  Normal urethral meatus, no lesions, no prolapse, no discharge.   No urethral masses, tenderness and/or tenderness. No bladder fullness, tenderness or masses. Pale vagina mucosa, poor estrogen effect, no discharge, no lesions, good pelvic support, Grade I cystocele is noted.  No rectocele is noted.  Cervix and uterus are surgically absent.  No adnexal/parametria masses or tenderness noted.  Anus and perineum are without rashes or lesions.    Skin: No rashes, bruises or suspicious lesions. Lymph: No cervical or inguinal adenopathy. Neurologic: Grossly intact, no focal deficits, moving all 4 extremities. Psychiatric: Normal mood and affect.  Laboratory Data: Lab Results  Component Value Date   WBC 9.8 11/20/2015   HGB 13.2 11/20/2015   HCT 38.2 11/20/2015   MCV 91.0 11/20/2015   PLT 148 (L) 11/20/2015    Lab Results  Component Value Date   CREATININE 0.67 11/20/2015    Lab Results  Component Value Date   AST 18 11/12/2015   Lab Results  Component Value Date   ALT 13 (L) 11/12/2015     Urinalysis Unremarkable.  See EPIC.   Pertinent Imaging: Results for Christina PoagMCALISTER, Litzy O (MRN [id number]) as of 08/28/2016 14:11  Ref. Range 08/24/2016 14:12  Scan Result Unknown 21    Assessment & Plan:    1. Recurrent UTI  - Patient is instructed to increase her water intake until the urine is pale yellow or clear.  I have advised her to take probiotics (yogurt, oral pills or vaginal suppositories), take cranberry pills or drink the juice and use the estrogen cream.  She is to take Vitamin C 1,000 mg daily to acidify the urine. She should also avoid soaking in tubs and wipe front to back after urinating.  She may benefit from core  strengthening exercises.  We can refer her to PT if she desires -  patient would like referral to PT  - Because of her history of recurrent UTIs, I have asked the patient to contact our office if she should experience symptoms of urinary tract infection so that we can CATH her for an urine specimen for urinalysis and culture. This is to prevent a skin contaminant from showing up in the urine culture.  If she should have her symptoms after hours or cannot get to our office, she should notify her other providers that she needs a catheterized specimen for UA and culture.   - I reviewed the symptoms of a urinary tract infection, such as a worsening of urinary urgency and frequency, dysuria, which is painful urination and not the pain of urine hitting sensitive perineal skin, hematuria, foul-smelling urine, suprapubic pain or mental status changes. Fevers, chills, nausea and or vomiting can also be signs of a possible UTI.  Positive urinalyses and positive urine cultures that are not associated with urinary symptoms should not be treated with antibiotics.    - I explained to the patient that being exposed to unnecessary antibiotics can put her at risk for increasing resistance of the bacteria to antibiotics, C. difficile and the side effects of the antibiotics.                - BLADDER SCAN AMB NON-IMAGING  - I have started a postcoital antibiotic for the patient, Keflex 250 mg, to take after she engages in sexual activity- she will contact us with any breakthrough infections   2. Vaginal atrophy  - I explained to the patient that when women go through menopause and her estrogen levels are severely diminished, the normal vaginal flora will change.  This is due to an increase of the vaginal canal's pH. Because of this, the vaginal canal may be colonized by bacteria from the rectum instead of the protective lactobacillus.  This accompanied by the loss of the mucus barrier with vaginal atrophy is a cause of recurrent urinary tract infections.  - In some studies, the use of vaginal  estrogen cream has been demonstrated to reduce  recurrent urinary tract infections to one a year.   - Patient was given a sample of vaginal estrogen cream (Premarin/Estrace) and instructed to apply 0.5mg  (pea-sized amount)  just inside the vaginal introitus with a finger-tip every night for two weeks and then Monday, Wednesday and Friday nights.  I explained to the patient that vaginally administered estrogen, which causes only a slight increase in the blood estrogen levels, have fewer contraindications and adverse systemic effects that oral HT.  - I have also given prescriptions for the Estrace cream and Premarin cream, so that the patient may carry them to the pharmacy to see which one of the branded creams would be most economical for her.  If she finds both medications cost prohibitive, she is instructed to call the office.  We can then call in a compounded vaginal estrogen cream for the patient that may be more affordable.    - She will follow up in three months for an exam.    3. Stress urinary incontinence  - offered behavioral therapies, bladder training, bladder control strategies, pelvic floor muscle training - patient would like a referral to PT  - fluid management   - RTC in 3 months for PVR and symptom recheck   Return in about 3 months (around 11/24/2016) for exam and symptom recheck.  These  notes generated with voice recognition software. I apologize for typographical errors.  Zara Council, [facility] [address], [place], [place] [address] [phone]

## 2016-08-27 LAB — CULTURE, URINE COMPREHENSIVE

## 2016-08-28 ENCOUNTER — Telehealth: Payer: Self-pay

## 2016-08-28 DIAGNOSIS — N390 Urinary tract infection, site not specified: Secondary | ICD-10-CM

## 2016-08-28 MED ORDER — SULFAMETHOXAZOLE-TRIMETHOPRIM 800-160 MG PO TABS: 1.0000 | ORAL_TABLET | Freq: Two times a day (BID) | ORAL | 0 refills | Status: AC

## 2016-08-28 NOTE — Telephone Encounter (Signed)
LMOM-medication sent to pharmacy 

## 2016-08-28 NOTE — Telephone Encounter (Signed)
-----   Message from Harle BattiestShannon A McGowan, PA-C sent at 08/27/2016 12:06 PM EDT ----- Patient's urine culture was positive.  She needs to start Septra DS, one tablet twice daily.

## 2016-08-31 NOTE — Telephone Encounter (Signed)
LMOM

## 2016-09-01 NOTE — Telephone Encounter (Signed)
LMOM   Pt pharmacy stated pt has already picked up medication.

## 2016-09-16 ENCOUNTER — Ambulatory Visit: Payer: 59

## 2016-09-30 ENCOUNTER — Ambulatory Visit
Admission: RE | Admit: 2016-09-30 | Discharge: 2016-09-30 | Disposition: A | Discharge status: Home / Self Care | Payer: 59 | Source: Ambulatory Visit | Attending: Obstetrics and Gynecology | Admitting: Obstetrics and Gynecology

## 2016-09-30 DIAGNOSIS — Z1231 Encounter for screening mammogram for malignant neoplasm of breast: Secondary | ICD-10-CM | POA: Insufficient documentation

## 2016-10-06 ENCOUNTER — Other Ambulatory Visit: Payer: Self-pay | Admitting: *Deleted

## 2016-10-06 ENCOUNTER — Inpatient Hospital Stay
Admission: RE | Admit: 2016-10-06 | Discharge: 2016-10-06 | Disposition: A | Discharge status: Home / Self Care | Payer: Self-pay | Source: Ambulatory Visit | Attending: *Deleted | Admitting: *Deleted

## 2016-10-06 DIAGNOSIS — Z9289 Personal history of other medical treatment: Secondary | ICD-10-CM

## 2016-11-24 ENCOUNTER — Encounter: Payer: Self-pay | Admitting: Urology

## 2016-11-24 ENCOUNTER — Ambulatory Visit: Payer: 59 | Admitting: Urology

## 2016-11-24 VITALS — BP 108/73 | HR 65 | Ht 63.0 in | Wt 156.9 lb

## 2016-11-24 DIAGNOSIS — N390 Urinary tract infection, site not specified: Secondary | ICD-10-CM | POA: Diagnosis not present

## 2016-11-24 DIAGNOSIS — N393 Stress incontinence (female) (male): Secondary | ICD-10-CM

## 2016-11-24 DIAGNOSIS — N952 Postmenopausal atrophic vaginitis: Secondary | ICD-10-CM

## 2016-11-24 LAB — BLADDER SCAN AMB NON-IMAGING: SCAN RESULT: 67

## 2016-11-24 NOTE — Progress Notes (Signed)
11/24/2016 5:01 PM   Christina Bates 1966-09-17 [id number]  Referring provider: Leotis Shames, MD [address] [facility] [place], [place] [address]  Chief Complaint  Patient presents with  . Recurrent UTI    HPI: Patient is a 51 year old Caucasian female who presents today for a three month follow up for recurrent UTI's, vaginal atrophy and SUI.  Background history Patient is a 25 -year-old Caucasian female who is referred to [place] by, Dr. Thedore Mins, for recurrent urinary tract infections.  Patient states that she has had 4 to 5 urinary tract infections over the last year.  Her symptoms with a urinary tract infection consist of dysuria, urgency and suprapubic pain.   She denies gross hematuria, back pain, abdominal pain or flank pain.  She has not had any recent fevers, chills, nausea or vomiting.  She does not have a history of nephrolithiasis, GU surgery or GU trauma.  Reviewing her records,  she has had two documented infections for E.coli that was pan-sensitive.  She is sexually active.  She has noted a correlation with her urinary tract infections and sexual intercourse.   She is peri menopausal.  She does not engage in anal sex.   She is not voiding before sex, but she is voiding after sex.  She denies constipation and/or diarrhea.  She does engage in good perineal hygiene. She does not take tub baths, but she does shave her legs in the bath tub.  She does have incontinence.  She is using incontinence pads (panty liners).  Two daily.  She is not having pain with bladder filling.  She has not had any recent imaging studies.  She is drinking limited of water daily.  Her IPSS score is 9/5.  Her PVR today was 21 mL.  She states she is getting up 4 times nightly to urinate.  Her UA was unremarkable.    Today, she is complaining of frequency, nocturia and incontinence.  Her PVR was 67 mL.  She stated that she had an episode of gross hematuria one month ago and she took  antibiotics that she had on hand.  The hematuria cleared.  She is bothered a great deal with an uncomfortable urge to urinate, a sudden urge to urinate with little or no warning, night time urination, waking up at night due to needing to urinate and losing urine with urgency.  Her UA today was unremarkable.      PMH: Past Medical History:  Diagnosis Date  . Anxiety   . Complication of anesthesia   . Hearing loss of both ears   . Heartburn   . HLD (hyperlipidemia)   . HSV infection   . PONV (postoperative nausea and vomiting)   . RLS (restless legs syndrome)    Occurs on occasion, better now.    Surgical History: Past Surgical History:  Procedure Laterality Date  . CESAREAN SECTION  1992 & 1993  . COCHLEAR IMPLANT Left 2004  . CYSTOSCOPY N/A 11/19/2015   Procedure: CYSTOSCOPY;  Surgeon: Conard Novak, MD;  Location: [facility] ORS;  Service: Gynecology;  Laterality: N/A;  . DILATION AND CURETTAGE OF UTERUS    . ENDOMETRIAL ABLATION    . FOOT SURGERY Left 2011  . FOOT SURGERY Right 2008  . HYSTEROSCOPY    . LAPAROSCOPIC HYSTERECTOMY N/A 11/19/2015   Procedure: HYSTERECTOMY TOTAL LAPAROSCOPIC/BILATERAL SALPINGECTOMY;  Surgeon: Conard Novak, MD;  Location: [facility] ORS;  Service: Gynecology;  Laterality: N/A;  . TONSILLECTOMY  1972  . TUBAL LIGATION      Home Medications:  Allergies as of 11/24/2016      Reactions   Bee Venom    Bacitracin Itching, Rash      Medication List       Accurate as of 11/24/16  5:01 PM. Always use your most recent med list.          ALPRAZolam 0.25 MG tablet Commonly known as:  XANAX   cephALEXin 250 MG capsule Commonly known as:  KEFLEX Take one tablet after intercourse   cholecalciferol 1000 units tablet Commonly known as:  VITAMIN D Take 1,000 Units by mouth daily.   conjugated estrogens vaginal cream Commonly known as:  PREMARIN Place 1 Applicatorful vaginally daily. Apply 0.5mg  (pea-sized amount)  just inside the vaginal introitus  with a finger-tip every night for two weeks and then Monday, Wednesday and Friday nights.   estradiol 0.1 MG/GM vaginal cream Commonly known as:  ESTRACE VAGINAL Apply 0.5mg  (pea-sized amount)  just inside the vaginal introitus with a finger-tip every night for two weeks and then Monday, Wednesday and Friday nights.   ferrous sulfate 325 (65 FE) MG tablet Take 325 mg by mouth every other day.   ibuprofen 600 MG tablet Commonly known as:  ADVIL,MOTRIN Take 1 tablet (600 mg total) by mouth every 6 (six) hours as needed (mild pain).   methocarbamol 750 MG tablet Commonly known as:  ROBAXIN TK 1 T PO TID. DISCONTINUE ZANAFLEX   metroNIDAZOLE 0.75 % cream Commonly known as:  METROCREAM Apply topically.   oxyCODONE-acetaminophen 5-325 MG tablet Commonly known as:  PERCOCET/ROXICET Take 1-2 tablets by mouth every 4 (four) hours as needed (moderate (1 pill with pain score 4-7/10)  to severe pain (2 pills, pain score >7/10)).   pantoprazole 40 MG tablet Commonly known as:  PROTONIX 40 mg daily.   RA VITAMIN B-12 TR 1000 MCG Tbcr Generic drug:  Cyanocobalamin Take by mouth.   tiZANidine 4 MG tablet Commonly known as:  ZANAFLEX Take 4 mg by mouth.   traMADol-acetaminophen 37.5-325 MG tablet Commonly known as:  ULTRACET TAKE 1 TABLET BY MOUTH EVERY 8 HOURS AS NEEDED FOR PAIN  FOR UP TO 10 DAYS   valACYclovir 1000 MG tablet Commonly known as:  VALTREX Take 1,000 mg by mouth every other day.       Allergies:  Allergies  Allergen Reactions  . Bee Venom   . Bacitracin Itching and Rash    Family History: Family History  Problem Relation Age of Onset  . Kidney disease Neg Hx   . Bladder Cancer Neg Hx   . Breast cancer Neg Hx     Social History:  reports that she quit smoking about 5 years ago. She has never used smokeless tobacco. She reports that she drinks alcohol. She reports that she does not use drugs.  ROS: UROLOGY Frequent Urination?: Yes Hard to postpone  urination?: No Burning/pain with urination?: No Get up at night to urinate?: Yes Leakage of urine?: Yes Urine stream starts and stops?: No Trouble starting stream?: No Do you have to strain to urinate?: No Blood in urine?: No Urinary tract infection?: No Sexually transmitted disease?: No Injury to kidneys or bladder?: No Painful intercourse?: No Weak stream?: No Currently pregnant?: No Vaginal bleeding?: No Last menstrual period?: n  Gastrointestinal Nausea?: No Vomiting?: No Indigestion/heartburn?: No Diarrhea?: No Constipation?: No  Constitutional Fever: No Night sweats?: No Weight loss?: No Fatigue?: No  Skin Skin rash/lesions?: No Itching?:  No  Eyes Blurred vision?: No Double vision?: No  Ears/Nose/Throat Sore throat?: No Sinus problems?: No  Hematologic/Lymphatic Swollen glands?: No Easy bruising?: No  Cardiovascular Leg swelling?: No Chest pain?: No  Respiratory Cough?: No Shortness of breath?: No  Endocrine Excessive thirst?: No  Musculoskeletal Back pain?: No Joint pain?: No  Neurological Headaches?: No Dizziness?: No  Psychologic Depression?: No Anxiety?: Yes  Physical Exam: BP 108/73 (BP Location: Left Arm, Patient Position: Sitting, Cuff Size: Large)   Pulse 65   Ht 5\' 3"  (1.6 m)   Wt 156 lb 14.4 oz (71.2 kg)   LMP 11/09/2015   BMI 27.79 kg/m   Constitutional: Well nourished. Alert and oriented, No acute distress. HEENT: [place] AT, moist mucus membranes. Trachea midline, no masses. Cardiovascular: No clubbing, cyanosis, or edema. Respiratory: Normal respiratory effort, no increased work of breathing. GI: Abdomen is soft, non tender, non distended, no abdominal masses. Liver and spleen not palpable.  No hernias appreciated.  Stool sample for occult testing is not indicated.   GU: No CVA tenderness.  No bladder fullness or masses.  Atrophic external genitalia, normal pubic hair distribution, no lesions.  Normal urethral meatus,  no lesions, no prolapse, no discharge.   No urethral masses, tenderness and/or tenderness. Anus and perineum are without rashes or lesions.    Skin: No rashes, bruises or suspicious lesions. Lymph: No cervical or inguinal adenopathy. Neurologic: Grossly intact, no focal deficits, moving all 4 extremities. Psychiatric: Normal mood and affect.  Laboratory Data: Lab Results  Component Value Date   WBC 9.8 11/20/2015   HGB 13.2 11/20/2015   HCT 38.2 11/20/2015   MCV 91.0 11/20/2015   PLT 148 (L) 11/20/2015    Lab Results  Component Value Date   CREATININE 0.67 11/20/2015    Lab Results  Component Value Date   AST 18 11/12/2015   Lab Results  Component Value Date   ALT 13 (L) 11/12/2015     Urinalysis Unremarkable.  See EPIC.   Pertinent Imaging: Results for CHASTELYN, ATHENS (MRN [id number]) as of 11/24/2016 17:01  Ref. Range 11/24/2016 16:11  Scan Result Unknown 67     Assessment & Plan:    1. Recurrent UTI  - UTI prevention strategies reviewed  - discussed antibiotic stewardship  - asked the patient to contact us with signs of infection   2. Vaginal atrophy  - patient to continue the vaginal estrogen cream three nights weekly  3. Stress urinary incontinence  - offered behavioral therapies, bladder training, bladder control strategies, pelvic floor muscle training - patient begins PT this week  - fluid management   - RTC after PT  Return for follow up after PT.  These notes generated with voice recognition software. I apologize for typographical errors.  Michiel Cowboy, PA-C  [facility] [address], [address] [place], [place] [address] [phone]

## 2016-11-25 LAB — URINALYSIS, COMPLETE
BILIRUBIN UA: NEGATIVE
Glucose, UA: NEGATIVE
Ketones, UA: NEGATIVE
Leukocytes, UA: NEGATIVE
Nitrite, UA: NEGATIVE
PH UA: 6.5 (ref 5.0–7.5)
Protein, UA: NEGATIVE
RBC UA: NEGATIVE
Specific Gravity, UA: 1.025 (ref 1.005–1.030)
UUROB: 2 mg/dL — AB (ref 0.2–1.0)

## 2016-11-25 LAB — MICROSCOPIC EXAMINATION: WBC UA: NONE SEEN /HPF (ref 0–?)

## 2016-11-26 ENCOUNTER — Ambulatory Visit: Payer: Commercial Managed Care - HMO | Attending: Urology | Admitting: Physical Therapy

## 2016-11-26 ENCOUNTER — Encounter: Payer: Self-pay | Admitting: Physical Therapy

## 2016-11-26 DIAGNOSIS — M6281 Muscle weakness (generalized): Secondary | ICD-10-CM | POA: Diagnosis not present

## 2016-11-26 DIAGNOSIS — R279 Unspecified lack of coordination: Secondary | ICD-10-CM | POA: Diagnosis present

## 2016-11-26 NOTE — Patient Instructions (Addendum)
  ______________   This week: changing your ratio 16 fl oz of water  16 floz glasses of decaf tea in the evening, 20 oz bottle of soda ==36 fl oz of bladder irritant  Decrease bladder irritant by decreasing soda from 20 fl oz to 16 floz  Increase water from 16 floz to 20 fl oz   And the following week,  Increased water from 20 fl oz to 24 fl oz Decrease soda to 8 flo oz   Decrease water decaf tea 2 hrs prior to bed to decrease night time voiding . You can sip on water if thirsty during the 2 hrs before bed  _____________   Abdominal scar ( gentle pressure)  Zig zag 10 reps Left to Right Then 3 strokes to the belly button from pubic bone and each side of the hip bone = 3 sets    _____________  Breathing from ribcage on inhale to relax pelvic floor when urinating, defecating, and during intercourse  \

## 2016-11-27 NOTE — Therapy (Signed)
[place] [facility] [address] [place], [place], [address] Phone: [phone]   Fax:  [phone]  Physical Therapy Evaluation  Patient Details  Name: Lauris PoagSharon O Joynt MRN: [id number] Date of Birth: 02/23/1966 Referring Provider: Therisa DoyneMc Gowan  Encounter Date: 11/26/2016      PT End of Session - 11/27/16 1802    Visit Number 1   Number of Visits 12   Date for PT Re-Evaluation 02/19/17   PT Start Time 1600   PT Stop Time 1710   PT Time Calculation (min) 70 min   Activity Tolerance Patient tolerated treatment well;No increased pain   Behavior During Therapy WFL for tasks assessed/performed      Past Medical History:  Diagnosis Date  . Anxiety   . Complication of anesthesia   . Hearing loss of both ears   . Heartburn   . HLD (hyperlipidemia)   . HSV infection   . PONV (postoperative nausea and vomiting)   . RLS (restless legs syndrome)    Occurs on occasion, better now.    Past Surgical History:  Procedure Laterality Date  . CESAREAN SECTION  1992 & 1993  . COCHLEAR IMPLANT Left 2004  . CYSTOSCOPY N/A 11/19/2015   Procedure: CYSTOSCOPY;  Surgeon: Conard NovakStephen D Jackson, MD;  Location: [facility] ORS;  Service: Gynecology;  Laterality: N/A;  . DILATION AND CURETTAGE OF UTERUS    . ENDOMETRIAL ABLATION    . FOOT SURGERY Left 2011  . FOOT SURGERY Right 2008  . HYSTEROSCOPY    . LAPAROSCOPIC HYSTERECTOMY N/A 11/19/2015   Procedure: HYSTERECTOMY TOTAL LAPAROSCOPIC/BILATERAL SALPINGECTOMY;  Surgeon: Conard NovakStephen D Jackson, MD;  Location: [facility] ORS;  Service: Gynecology;  Laterality: N/A;  . TONSILLECTOMY  1972  . TUBAL LIGATION      There were no vitals filed for this visit.       Subjective Assessment - 11/26/16 1611    Subjective 1) recurrent UTI occur 3x year. Completed antibiotics and currently do not have any UTI.  Daily fluid intake 16 fl oz of water, 2 glasses of tea in the evening, 20 oz bottle of soda, no juice, 2 cups of  coffee    2) nocturia: 2 x night, denied leakage at night.          3) Urinary incontinence that occurs with cough, sneezing, and laughing. Pt changes her pantyliners 2 x day.    4) LBP after standing for long periods 3-4 hrs. 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   5) constipation: bowel movements vary between daily to every other 2-3 days. Sometimes requires straining. [place] Stool type 1,3,4      Pertinent History Anxiety attacks 1-2 x/ week and medication eases her attacks. Plantar fasciitis on B feet, 2 C-sections, polyps removed from uterus, and ablation, hysterectomy 11/19/15. Genital herpes managed with medication    Patient Stated Goals stop leakage             [facility] - 11/27/16 1748      Assessment   Medical Diagnosis urinary incontinence   Referring Provider Therisa DoyneMc Gowan     Precautions   Precautions None     Restrictions   Weight Bearing Restrictions No     Balance Screen   Has the patient fallen in the past 6 months No     Prior Function   Level of Independence Independent     Observation/Other Assessments   Other Surveys  --  to be administered at next session     Coordination   Gross Motor Movements are Fluid and Coordinated --  chest breathing    Fine Motor Movements are Fluid and Coordinated --  demo'd pelvic floor lengthening with diaphragm breathing      Posture/Postural Control   Posture Comments lumbopelvic instability with leg movements      Strength   Overall Strength Comments L LE hip abd 3/5 with leg pain, 4/5 RLE      Palpation   Spinal mobility no obliquities   Palpation comment increased abdominal scar restrictions R > L  with tenderness on R      Bed Mobility   Bed Mobility --  head lift method                    [facility] Adult PT Treatment/Exercise - 11/27/16 1748      Therapeutic Activites    Therapeutic Activities --  see pt instructions     Manual Therapy   Manual therapy  comments gentle scar massage                 PT Education - 11/27/16 1801    Education provided Yes   Education Details POC, goals, anatomy/ physiology, HEP   Person(s) Educated Patient;Spouse   Methods Explanation;Demonstration;Tactile cues;Verbal cues;Handout   Comprehension Returned demonstration;Verbalized understanding;Verbal cues required;Tactile cues required             PT Long Term Goals - 11/26/16 1633      PT LONG TERM GOAL #1   Title Pt will decrease her urinary pad wear from 2  pads/ day to < 1 pad a day across 1 week in order to improve QOL   Time 12   Period Days   Status New     PT LONG TERM GOAL #2   Title Pt will report decreased LBP from 3-4/10 after standing for 3-4 hours at work to a level of 1-2 /10 in order to perform work duties   Time 12   Period Weeks   Status New     PT LONG TERM GOAL #3   Title Pt will report an improvement in her stool constistency to Stool Type 3-4 100%  of the time and no type 1 stools across 2 weeks in order to decrease straining the pelvic floor and improve GI health   Time 12   Period Weeks   Status New     PT LONG TERM GOAL #4   Title Pt will decrease her PFDI score from % to < % in order to restore pelvic floor function   Time 12   Period Weeks   Status New               Plan - 11/27/16 1802    Clinical Impression Statement Pt is a 51 yo female who c/o recurrent UTIs, SUI, nocturia, constipation, and LBP. These deficits impact  her QOL and ability to work. Pt's personal contributing factors include Hx of anxiety, plantar fasciitis on B feet, 2 C-sections, polyps removed from uterus, and ablation, and hysterectomy 11/19/15.  Pt's clinical factors include increased scar restrictions over low abdomen, dyscoordination and weakness of deep core mm, poor body mechanics with functional activities, poor bladder/bowel health habits, and hip weakness.     Rehab Potential Good   PT Frequency 1x / week   PT  Duration 12 weeks   PT Treatment/Interventions Stair training;Moist Heat;Traction;Aquatic Therapy;Therapeutic exercise;Electrical Stimulation;Balance training;Therapeutic activities;Functional  mobility training;Neuromuscular re-education;Patient/family education;Scar mobilization;Manual lymph drainage;Manual techniques;Energy conservation;Taping   Consulted and Agree with Plan of Care Patient      Patient will benefit from skilled therapeutic intervention in order to improve the following deficits and impairments:  Pain, Improper body mechanics, Increased fascial restricitons, Postural dysfunction, Decreased scar mobility, Decreased mobility, Decreased coordination, Decreased activity tolerance, Decreased range of motion, Decreased strength, Decreased balance, Decreased safety awareness, Decreased endurance  Visit Diagnosis: Muscle weakness (generalized)  Unspecified lack of coordination     Problem List Patient Active Problem List   Diagnosis Date Noted  . Pure hypercholesterolemia 03/24/2016  . Acute neck pain 12/31/2015  . Acute pain of left hip 12/31/2015  . Anxiety 12/31/2015  . Menorrhagia with irregular cycle 11/19/2015  . Dysmenorrhea 11/19/2015  . Status post laparoscopic hysterectomy 11/19/2015  . Recurrent genital herpes 08/15/2015  . Vitamin B 12 deficiency 08/15/2015  . Low vitamin B12 level 03/20/2015  . Vitamin D deficiency 10/06/2014  . Absolute anemia 10/05/2014  . Gastro-esophageal reflux disease without esophagitis 10/05/2014    Mariane Masters ,PT, DPT, E-RYT  11/27/2016, 6:12 PM  [facility] MAIN [facility] SERVICES [address] [place], [place], [address] Phone: [phone]   Fax:  [phone]  Name: TAMITHA NORELL MRN: [id number] Date of Birth: 05-26-1966

## 2016-12-02 ENCOUNTER — Ambulatory Visit: Payer: Commercial Managed Care - HMO | Admitting: Physical Therapy

## 2016-12-10 ENCOUNTER — Ambulatory Visit: Payer: Commercial Managed Care - HMO | Admitting: Physical Therapy

## 2016-12-16 ENCOUNTER — Ambulatory Visit: Payer: Commercial Managed Care - HMO | Admitting: Physical Therapy

## 2016-12-16 DIAGNOSIS — R279 Unspecified lack of coordination: Secondary | ICD-10-CM

## 2016-12-16 DIAGNOSIS — M6281 Muscle weakness (generalized): Secondary | ICD-10-CM

## 2016-12-16 NOTE — Therapy (Signed)
[place] [facility] [address] [place], [place], [address] Phone: [phone]   Fax:  [phone]  Physical Therapy Treatment  Patient Details  Name: Christina PoagSharon O Leite MRN: [id number] Date of Birth: 1966-11-07 Referring Provider: Therisa DoyneMc Gowan  Encounter Date: 12/16/2016      PT End of Session - 12/16/16 1619    Visit Number 2   Number of Visits 12   Date for PT Re-Evaluation 02/19/17   PT Start Time 1615   Activity Tolerance Patient tolerated treatment well;No increased pain   Behavior During Therapy WFL for tasks assessed/performed      Past Medical History:  Diagnosis Date  . Anxiety   . Complication of anesthesia   . Hearing loss of both ears   . Heartburn   . HLD (hyperlipidemia)   . HSV infection   . PONV (postoperative nausea and vomiting)   . RLS (restless legs syndrome)    Occurs on occasion, better now.    Past Surgical History:  Procedure Laterality Date  . CESAREAN SECTION  1992 & 1993  . COCHLEAR IMPLANT Left 2004  . CYSTOSCOPY N/A 11/19/2015   Procedure: CYSTOSCOPY;  Surgeon: Conard NovakStephen D Jackson, MD;  Location: [facility] ORS;  Service: Gynecology;  Laterality: N/A;  . DILATION AND CURETTAGE OF UTERUS    . ENDOMETRIAL ABLATION    . FOOT SURGERY Left 2011  . FOOT SURGERY Right 2008  . HYSTEROSCOPY    . LAPAROSCOPIC HYSTERECTOMY N/A 11/19/2015   Procedure: HYSTERECTOMY TOTAL LAPAROSCOPIC/BILATERAL SALPINGECTOMY;  Surgeon: Conard NovakStephen D Jackson, MD;  Location: [facility] ORS;  Service: Gynecology;  Laterality: N/A;  . TONSILLECTOMY  1972  . TUBAL LIGATION      There were no vitals filed for this visit.      Subjective Assessment - 12/16/16 1618    Subjective Pt reported the breathing practices has helped her with bowel movements. Pt has started to decrease her soda and increasing water.    Pertinent History Anxiety attacks 1-2 x/ week and medication eases her attacks. Plantar fasciitis on B feet, 2 C-sections,  polyps removed from uterus, and ablation, hysterectomy 11/19/15. Genital herpes managed with medication    Patient Stated Goals stop leakage             [facility] PT Assessment - 12/16/16 1648      Coordination   Gross Motor Movements are Fluid and Coordinated --  required cuing for lumbopelvic stability with deep core level                  Pelvic Floor Special Questions - 12/16/16 1645    Prolapse --  bladder more cranial w/ pillow under hips   Pelvic Floor Internal Exam pt consented verbally without contraindications    Exam Type Vaginal   Strength fair squeeze, definite lift  posterior> anterior, bladder lowered   Strength # of reps 10   Strength # of seconds 1           [facility] Adult PT Treatment/Exercise - 12/16/16 1659      Therapeutic Activites    Therapeutic Activities --  pt instructions     Neuro Re-ed    Neuro Re-ed Details  see pt instructions     Manual Therapy   Manual therapy comments abdominal scar massage                 PT Education - 12/16/16 1659    Education provided Yes   Education Details HEP  Person(s) Educated Patient   Methods Explanation;Demonstration;Tactile cues;Verbal cues;Handout   Comprehension Returned demonstration;Verbalized understanding;Verbal cues required;Tactile cues required             PT Long Term Goals - 11/26/16 1633      PT LONG TERM GOAL #1   Title Pt will decrease her urinary pad wear from 2  pads/ day to < 1 pad a day across 1 week in order to improve QOL   Time 12   Period Days   Status New     PT LONG TERM GOAL #2   Title Pt will report decreased LBP from 3-4/10 after standing for 3-4 hours at work to a level of 1-2 /10 in order to perform work duties   Time 12   Period Weeks   Status New     PT LONG TERM GOAL #3   Title Pt will report an improvement in her stool constistency to Stool Type 3-4 100%  of the time and no type 1 stools across 2 weeks in order to decrease straining the  pelvic floor and improve GI health   Time 12   Period Weeks   Status New     PT LONG TERM GOAL #4   Title Pt will decrease her PFDI score from % to < % in order to restore pelvic floor function   Time 12   Period Weeks   Status New               Plan - 12/16/16 1700    Clinical Impression Statement Pt demo'd a more cranial position of her bladder and a more circumferential contraction of her pelvic floor mm following Tx. Initiated pelvic floor training (quick contractions) and  deep core level 2. Pt demo'd correctly with moderate cues. Pt will continue to benefit from skilled PT   Rehab Potential Good   PT Frequency 1x / week   PT Duration 12 weeks   PT Treatment/Interventions Stair training;Moist Heat;Traction;Aquatic Therapy;Therapeutic exercise;Electrical Stimulation;Balance training;Therapeutic activities;Functional mobility training;Neuromuscular re-education;Patient/family education;Scar mobilization;Manual lymph drainage;Manual techniques;Energy conservation;Taping   Consulted and Agree with Plan of Care Patient      Patient will benefit from skilled therapeutic intervention in order to improve the following deficits and impairments:  Pain, Improper body mechanics, Increased fascial restricitons, Postural dysfunction, Decreased scar mobility, Decreased mobility, Decreased coordination, Decreased activity tolerance, Decreased range of motion, Decreased strength, Decreased balance, Decreased safety awareness, Decreased endurance  Visit Diagnosis: Muscle weakness (generalized)  Unspecified lack of coordination     Problem List Patient Active Problem List   Diagnosis Date Noted  . Pure hypercholesterolemia 03/24/2016  . Acute neck pain 12/31/2015  . Acute pain of left hip 12/31/2015  . Anxiety 12/31/2015  . Menorrhagia with irregular cycle 11/19/2015  . Dysmenorrhea 11/19/2015  . Status post laparoscopic hysterectomy 11/19/2015  . Recurrent genital herpes  08/15/2015  . Vitamin B 12 deficiency 08/15/2015  . Low vitamin B12 level 03/20/2015  . Vitamin D deficiency 10/06/2014  . Absolute anemia 10/05/2014  . Gastro-esophageal reflux disease without esophagitis 10/05/2014    Mariane Masters ,PT, DPT, E-RYT  12/16/2016, 5:02 PM  [place] [facility] MAIN [facility] SERVICES [address] [place], [place], [address] Phone: [phone]   Fax:  [phone]  Name: NASIYA PASCUAL MRN: [id number] Date of Birth: 12/18/1965

## 2016-12-16 NOTE — Patient Instructions (Signed)
Scar massage 5 min gentle pressure (on last session's homework)   __________   Bonita QuinYou are now ready to begin training the deep core muscles system: diaphragm, transverse abdominis, pelvic floor . These muscles must work together as a team.           The key to these exercises to train the brain to coordinate the timing of these muscles and to have them turn on for long periods of time to hold you upright against gravity (especially important if you are on your feet all day).These muscles are postural muscles and play a role stabilizing your spine and bodyweight. By doing these repetitions slowly and correctly instead of doing crunches, you will achieve a flatter belly without a lower pooch. You are also placing your spine in a more neutral position and breathing properly which in turn, decreases your risk for problems related to your pelvic floor, abdominal, and low back such as pelvic organ prolapse, hernias, diastasis recti (separation of superficial muscles), disk herniations, spinal fractures. These exercises set a solid foundation for you to later progress to resistance/ strength training with therabands and weights and return to other typical fitness exercises with a stronger deeper core.   Do level 1 : 10 reps  WITH QUICK pelvic floor squeeze at the end of exhalation   Do level 2: 10 reps (left and right = 1 rep) x 3 sets , 2 x day Do not progress to level 3 for 3-4 weeks. You know you are ready when you do not have any rocking of pelvis nor arching in your back    (see handout)   _________

## 2016-12-30 ENCOUNTER — Ambulatory Visit: Payer: 59 | Attending: Urology | Admitting: Physical Therapy

## 2016-12-30 DIAGNOSIS — R279 Unspecified lack of coordination: Secondary | ICD-10-CM | POA: Insufficient documentation

## 2016-12-30 DIAGNOSIS — M6281 Muscle weakness (generalized): Secondary | ICD-10-CM | POA: Diagnosis not present

## 2016-12-30 NOTE — Patient Instructions (Addendum)
Discontinue pelvic floor contractions  Only perform  deep core level 1 (10 reps)   Breathing and relaxing the pelvic floor ---inhale to a count of 1-2 pause,  Exhale 2-1- pause to avoid light headedness    Deep core level 2 ( 10 reps x 3 )    In the morning and again in the evening  _________  Pelvic floor stretches:   Figure -4  With towel , pull towel to knee or do seated 5 breaths    ___________   Relaxing before driving  Before starting the car:  Take 3 slow breaths ( 1-2- pause, 2-1 pause x 3 ) Get into position by noticing feet on the floor, sitting bones on the seat,   Then paying attention to the road  when driving   But no need to practice breathing while driving because that would be distracting

## 2016-12-30 NOTE — Therapy (Signed)
[place] MAIN [facility] SERVICES [address] [place], [place], [address] Phone: [phone]   Fax:  [phone]  Physical Therapy Treatment  Patient Details  Name: Christina Bates MRN: [id number] Date of Birth: [birth year] Referring Provider: Caleen Jobs  Encounter Date: 12/30/2016      PT End of Session - 12/30/16 1716    Visit Number 3   Number of Visits 12   Date for PT Re-Evaluation 02/19/17   PT Start Time 1610   PT Stop Time 1710   PT Time Calculation (min) 60 min   Activity Tolerance Patient tolerated treatment well;No increased pain   Behavior During Therapy WFL for tasks assessed/performed      Past Medical History:  Diagnosis Date  . Anxiety   . Complication of anesthesia   . Hearing loss of both ears   . Heartburn   . HLD (hyperlipidemia)   . HSV infection   . PONV (postoperative nausea and vomiting)   . RLS (restless legs syndrome)    Occurs on occasion, better now.    Past Surgical History:  Procedure Laterality Date  . [place]  . COCHLEAR IMPLANT Left 2004  . CYSTOSCOPY N/A 11/19/2015   Procedure: CYSTOSCOPY;  Surgeon: Will Bonnet, MD;  Location: [facility] ORS;  Service: Gynecology;  Laterality: N/A;  . DILATION AND CURETTAGE OF UTERUS    . ENDOMETRIAL ABLATION    . FOOT SURGERY Left 2011  . FOOT SURGERY Right 2008  . HYSTEROSCOPY    . LAPAROSCOPIC HYSTERECTOMY N/A 11/19/2015   Procedure: HYSTERECTOMY TOTAL LAPAROSCOPIC/BILATERAL SALPINGECTOMY;  Surgeon: Will Bonnet, MD;  Location: [facility] ORS;  Service: Gynecology;  Laterality: N/A;  . TONSILLECTOMY  1972  . TUBAL LIGATION      There were no vitals filed for this visit.      Subjective Assessment - 12/30/16 1614    Subjective Pt reports she has noticed she tenses up when driving. Pt has not had time to practice her HEP. Pt performs lots of heavy lifting at work.    Pertinent History Anxiety attacks 1-2 x/ week and medication  eases her attacks. Plantar fasciitis on B feet, 2 C-sections, polyps removed from uterus, and ablation, hysterectomy 11/19/15. Genital herpes managed with medication    Patient Stated Goals stop leakage                       Pelvic Floor Special Questions - 12/30/16 1717    Prolapse --  bladder more cranial position,no pillow under hips needed   Pelvic Floor Internal Exam pt consented verbally without contraindications    Exam Type Vaginal   Palpation increased tenderness/ tensions on R obt int and first and 2nd layers     Strength good squeeze, good lift, able to hold agaisnt strong resistance   Strength # of reps --  withheld pelvic floor contractions due to overactivity R            [facility] Adult PT Treatment/Exercise - 12/30/16 1719      Therapeutic Activites    Therapeutic Activities --  see pt instructions     Neuro Re-ed    Neuro Re-ed Details  cued for proper technique for less lumbopelvic perturbation with deep core HEP     Manual Therapy   Manual therapy comments STM/ MET and thiele massage on R obt int and 1-2nd layers B  PT Education - 12/30/16 1716    Education provided Yes   Education Details HEP   Person(s) Educated Patient   Methods Demonstration;Explanation;Tactile cues;Verbal cues;Handout   Comprehension Returned demonstration;Verbalized understanding             PT Long Term Goals - 12/30/16 1615      PT LONG TERM GOAL #1   Title Pt will decrease her urinary pad wear from 2  pads/ day to < 1 pad a day across 1 week in order to improve QOL   Time 12   Period Days   Status On-going     PT LONG TERM GOAL #2   Title Pt will report decreased LBP from 3-4/10 after standing for 3-4 hours at work to a level of 1-2 /10 in order to perform work duties   Time 12   Period Weeks   Status On-going     PT LONG TERM GOAL #3   Title Pt will report an improvement in her stool constistency to Stool Type 3-4 100%  of the  time and no type 1 stools across 2 weeks in order to decrease straining the pelvic floor and improve GI health   Time 12   Period Weeks   Status Achieved     PT LONG TERM GOAL #4   Title Pt will decrease her PFDI score from % to < % in order to restore pelvic floor function   Time 12   Period Weeks   Status On-going               Plan - 12/30/16 1720    Clinical Impression Statement Pt demo'd a more cranial position of her bladder without a need to elevate her hips on a pillow which indicates increased improved fascial support compared to previous session. Pt is practicing exhalation with the repeated lifting she performs at work which is helpful towards her progress. Withheld pelvic floor quick contractions today 2/2 overactivity of R obt internus and 1-2 layers B.  Pt demo'd and reported less tensions and tenderness at R obturator internus  following Tx. Reviewed deep core level 1-2 which pt demo'd correctly. Anticipate pt will continue to benefit from skilled PT.      Rehab Potential Good   PT Frequency 1x / week   PT Duration 12 weeks   PT Treatment/Interventions Stair training;Moist Heat;Traction;Aquatic Therapy;Therapeutic exercise;Electrical Stimulation;Balance training;Therapeutic activities;Functional mobility training;Neuromuscular re-education;Patient/family education;Scar mobilization;Manual lymph drainage;Manual techniques;Energy conservation;Taping   Consulted and Agree with Plan of Care Patient      Patient will benefit from skilled therapeutic intervention in order to improve the following deficits and impairments:  Pain, Improper body mechanics, Increased fascial restricitons, Postural dysfunction, Decreased scar mobility, Decreased mobility, Decreased coordination, Decreased activity tolerance, Decreased range of motion, Decreased strength, Decreased balance, Decreased safety awareness, Decreased endurance  Visit Diagnosis: Muscle weakness  (generalized)  Unspecified lack of coordination     Problem List Patient Active Problem List   Diagnosis Date Noted  . Pure hypercholesterolemia 03/24/2016  . Acute neck pain 12/31/2015  . Acute pain of left hip 12/31/2015  . Anxiety 12/31/2015  . Menorrhagia with irregular cycle 11/19/2015  . Dysmenorrhea 11/19/2015  . Status post laparoscopic hysterectomy 11/19/2015  . Recurrent genital herpes 08/15/2015  . Vitamin B 12 deficiency 08/15/2015  . Low vitamin B12 level 03/20/2015  . Vitamin D deficiency 10/06/2014  . Absolute anemia 10/05/2014  . Gastro-esophageal reflux disease without esophagitis 10/05/2014    Jerl Mina ,PT, DPT, E-RYT  12/30/2016, 5:23 PM  [place] MAIN [facility] SERVICES [address] [place], [place], [address] Phone: [phone]   Fax:  [phone]  Name: Christina Bates MRN: [id number] Date of Birth: Sep 12, 1966

## 2017-01-13 ENCOUNTER — Ambulatory Visit: Payer: 59 | Admitting: Physical Therapy

## 2017-01-27 ENCOUNTER — Ambulatory Visit: Payer: Commercial Managed Care - HMO | Admitting: Physical Therapy

## 2017-01-27 DIAGNOSIS — H02413 Mechanical ptosis of bilateral eyelids: Secondary | ICD-10-CM | POA: Diagnosis not present

## 2017-02-16 DIAGNOSIS — K219 Gastro-esophageal reflux disease without esophagitis: Secondary | ICD-10-CM | POA: Diagnosis not present

## 2017-02-16 DIAGNOSIS — Z131 Encounter for screening for diabetes mellitus: Secondary | ICD-10-CM | POA: Diagnosis not present

## 2017-02-16 DIAGNOSIS — E7800 Pure hypercholesterolemia, unspecified: Secondary | ICD-10-CM | POA: Diagnosis not present

## 2017-02-23 DIAGNOSIS — K219 Gastro-esophageal reflux disease without esophagitis: Secondary | ICD-10-CM | POA: Diagnosis not present

## 2017-05-11 DIAGNOSIS — H02411 Mechanical ptosis of right eyelid: Secondary | ICD-10-CM | POA: Diagnosis not present

## 2017-05-11 DIAGNOSIS — H02413 Mechanical ptosis of bilateral eyelids: Secondary | ICD-10-CM | POA: Diagnosis not present

## 2017-05-11 DIAGNOSIS — H02412 Mechanical ptosis of left eyelid: Secondary | ICD-10-CM | POA: Diagnosis not present

## 2017-06-22 DIAGNOSIS — W57XXXA Bitten or stung by nonvenomous insect and other nonvenomous arthropods, initial encounter: Secondary | ICD-10-CM | POA: Diagnosis not present

## 2017-06-22 DIAGNOSIS — S80862A Insect bite (nonvenomous), left lower leg, initial encounter: Secondary | ICD-10-CM | POA: Diagnosis not present

## 2017-06-22 DIAGNOSIS — T63441A Toxic effect of venom of bees, accidental (unintentional), initial encounter: Secondary | ICD-10-CM | POA: Diagnosis not present

## 2017-06-29 DIAGNOSIS — K219 Gastro-esophageal reflux disease without esophagitis: Secondary | ICD-10-CM | POA: Diagnosis not present

## 2017-06-29 DIAGNOSIS — N390 Urinary tract infection, site not specified: Secondary | ICD-10-CM | POA: Diagnosis not present

## 2017-08-09 DIAGNOSIS — L820 Inflamed seborrheic keratosis: Secondary | ICD-10-CM | POA: Diagnosis not present

## 2017-08-09 DIAGNOSIS — B070 Plantar wart: Secondary | ICD-10-CM | POA: Diagnosis not present

## 2017-08-09 DIAGNOSIS — L718 Other rosacea: Secondary | ICD-10-CM | POA: Diagnosis not present

## 2017-08-17 DIAGNOSIS — R300 Dysuria: Secondary | ICD-10-CM | POA: Diagnosis not present

## 2017-09-01 ENCOUNTER — Other Ambulatory Visit: Payer: Self-pay | Admitting: Obstetrics and Gynecology

## 2017-09-16 DIAGNOSIS — M545 Low back pain: Secondary | ICD-10-CM | POA: Diagnosis not present

## 2017-09-16 DIAGNOSIS — M5416 Radiculopathy, lumbar region: Secondary | ICD-10-CM | POA: Diagnosis not present

## 2017-09-17 DIAGNOSIS — M5416 Radiculopathy, lumbar region: Secondary | ICD-10-CM | POA: Insufficient documentation

## 2017-10-01 DIAGNOSIS — Z1231 Encounter for screening mammogram for malignant neoplasm of breast: Secondary | ICD-10-CM | POA: Diagnosis not present

## 2017-10-13 DIAGNOSIS — M4698 Unspecified inflammatory spondylopathy, sacral and sacrococcygeal region: Secondary | ICD-10-CM | POA: Diagnosis not present

## 2017-10-26 DIAGNOSIS — M533 Sacrococcygeal disorders, not elsewhere classified: Secondary | ICD-10-CM | POA: Diagnosis not present

## 2017-12-27 DIAGNOSIS — M533 Sacrococcygeal disorders, not elsewhere classified: Secondary | ICD-10-CM | POA: Diagnosis not present

## 2017-12-27 DIAGNOSIS — M4698 Unspecified inflammatory spondylopathy, sacral and sacrococcygeal region: Secondary | ICD-10-CM | POA: Diagnosis not present

## 2018-02-23 DIAGNOSIS — K219 Gastro-esophageal reflux disease without esophagitis: Secondary | ICD-10-CM | POA: Diagnosis not present

## 2018-02-23 DIAGNOSIS — M47812 Spondylosis without myelopathy or radiculopathy, cervical region: Secondary | ICD-10-CM | POA: Insufficient documentation

## 2018-02-23 DIAGNOSIS — M47816 Spondylosis without myelopathy or radiculopathy, lumbar region: Secondary | ICD-10-CM

## 2018-02-23 DIAGNOSIS — R42 Dizziness and giddiness: Secondary | ICD-10-CM | POA: Diagnosis not present

## 2018-02-26 DIAGNOSIS — R42 Dizziness and giddiness: Secondary | ICD-10-CM | POA: Insufficient documentation

## 2018-03-07 ENCOUNTER — Encounter (HOSPITAL_COMMUNITY): Payer: 59

## 2018-04-06 DIAGNOSIS — K219 Gastro-esophageal reflux disease without esophagitis: Secondary | ICD-10-CM | POA: Diagnosis not present

## 2018-04-13 ENCOUNTER — Ambulatory Visit
Admission: EM | Admit: 2018-04-13 | Discharge: 2018-04-13 | Disposition: A | Discharge status: Home / Self Care | Payer: 59 | Attending: Family Medicine | Admitting: Family Medicine

## 2018-04-13 ENCOUNTER — Encounter: Payer: Self-pay | Admitting: Emergency Medicine

## 2018-04-13 ENCOUNTER — Other Ambulatory Visit: Payer: Self-pay

## 2018-04-13 DIAGNOSIS — J069 Acute upper respiratory infection, unspecified: Secondary | ICD-10-CM | POA: Diagnosis not present

## 2018-04-13 DIAGNOSIS — B9789 Other viral agents as the cause of diseases classified elsewhere: Secondary | ICD-10-CM | POA: Diagnosis not present

## 2018-04-13 MED ORDER — BENZONATATE 100 MG PO CAPS: 100.0000 mg | ORAL_CAPSULE | Freq: Three times a day (TID) | ORAL | 0 refills | Status: DC | PRN

## 2018-04-13 MED ORDER — AMOXICILLIN-POT CLAVULANATE 875-125 MG PO TABS: 1.0000 | ORAL_TABLET | Freq: Two times a day (BID) | ORAL | 0 refills | Status: DC

## 2018-04-13 MED ORDER — HYDROCOD POLST-CPM POLST ER 10-8 MG/5ML PO SUER: 5.0000 mL | Freq: Every evening | ORAL | 0 refills | Status: DC | PRN

## 2018-04-13 NOTE — ED Provider Notes (Signed)
[facility] ____________________________________________  Time seen: Approximately 1:33 PM  I have reviewed the triage vital signs and the nursing notes.   HISTORY  Chief Complaint Nasal Congestion   HPI Christina Bates is a 52 y.o. female presenting for evaluation of 5-day history of runny nose, nasal congestion, sinus drainage, intermittent sinus pressure, and reports cough that started yesterday.  Denies known fevers.  Denies known sick contacts.  Does report some occasional seasonal allergies.  Has tried over-the-counter DayQuil and NyQuil primarily with some other over-the-counter medications intermittently.  Continues to overall eat and drink normally.  Denies urinary or bowel changes.  Denies other aggravating or alleviating factors. Denies chest pain, shortness of breath, abdominal pain, or rash. Denies recent sickness. Denies recent antibiotic use.   Leotis Shames, MD: PCP   Past Medical History:  Diagnosis Date  . Anxiety   . Complication of anesthesia   . Hearing loss of both ears   . Heartburn   . HLD (hyperlipidemia)   . HSV infection   . PONV (postoperative nausea and vomiting)   . RLS (restless legs syndrome)    Occurs on occasion, better now.    Patient Active Problem List   Diagnosis Date Noted  . Pure hypercholesterolemia 03/24/2016  . Acute neck pain 12/31/2015  . Acute pain of left hip 12/31/2015  . Anxiety 12/31/2015  . Menorrhagia with irregular cycle 11/19/2015  . Dysmenorrhea 11/19/2015  . Status post laparoscopic hysterectomy 11/19/2015  . Recurrent genital herpes 08/15/2015  . Vitamin B 12 deficiency 08/15/2015  . Low vitamin B12 level 03/20/2015  . Vitamin D deficiency 10/06/2014  . Absolute anemia 10/05/2014  . Gastro-esophageal reflux disease without esophagitis 10/05/2014    Past Surgical History:  Procedure Laterality Date  . CESAREAN SECTION  1992 & 1993  . COCHLEAR IMPLANT Left 2004  . CYSTOSCOPY N/A 11/19/2015   Procedure: CYSTOSCOPY;  Surgeon: Conard Novak, MD;  Location: [facility] ORS;  Service: Gynecology;  Laterality: N/A;  . DILATION AND CURETTAGE OF UTERUS    . ENDOMETRIAL ABLATION    . FOOT SURGERY Left 2011  . FOOT SURGERY Right 2008  . HYSTEROSCOPY    . LAPAROSCOPIC HYSTERECTOMY N/A 11/19/2015   Procedure: HYSTERECTOMY TOTAL LAPAROSCOPIC/BILATERAL SALPINGECTOMY;  Surgeon: Conard Novak, MD;  Location: [facility] ORS;  Service: Gynecology;  Laterality: N/A;  . TONSILLECTOMY  1972  . TUBAL LIGATION       No current facility-administered medications for this encounter.   Current Outpatient Medications:  .  ALPRAZolam (XANAX) 0.25 MG tablet, , Disp: , Rfl:  .  cephALEXin (KEFLEX) 250 MG capsule, Take one tablet after intercourse, Disp: 90 capsule, Rfl: 0 .  cholecalciferol (VITAMIN D) 1000 UNITS tablet, Take 1,000 Units by mouth daily., Disp: , Rfl:  .  Cyanocobalamin (RA VITAMIN B-12 TR) 1000 MCG TBCR, Take by mouth., Disp: , Rfl:  .  ferrous sulfate 325 (65 FE) MG tablet, Take 325 mg by mouth every other day. , Disp: , Rfl:  .  pantoprazole (PROTONIX) 40 MG tablet, 40 mg daily. , Disp: , Rfl: 2 .  valACYclovir (VALTREX) 1000 MG tablet, Take 1,000 mg by mouth every other day. , Disp: , Rfl: 3 .  amoxicillin-clavulanate (AUGMENTIN) 875-125 MG tablet, Take 1 tablet by mouth every 12 (twelve) hours., Disp: 20 tablet, Rfl: 0 .  benzonatate (TESSALON PERLES) 100 MG capsule, Take 1 capsule (100 mg total) by mouth 3 (three) times daily as needed for cough., Disp: 15 capsule, Rfl: 0 .  chlorpheniramine-HYDROcodone (TUSSIONEX PENNKINETIC ER) 10-8 MG/5ML SUER, Take 5 mLs by mouth at bedtime as needed for cough. do not drive or operate machinery while taking as can cause drowsiness., Disp: 50 mL, Rfl: 0  Allergies Bee venom and Bacitracin  Family History  Problem Relation Age of Onset  . Hyperlipidemia Mother   . Hypertension Mother   . Hypertension Father   . Hyperlipidemia Father   . Kidney  disease Neg Hx   . Bladder Cancer Neg Hx   . Breast cancer Neg Hx     Social History Social History   Tobacco Use  . Smoking status: Former Smoker    Last attempt to quit: 11/12/2011    Years since quitting: 6.4  . Smokeless tobacco: Never Used  Substance Use Topics  . Alcohol use: Yes    Alcohol/week: 0.0 oz    Comment: 3-5 drinks per year  . Drug use: No    Review of Systems Constitutional: No fever/chills ENT: Reports some mild intermittent sore throat. Cardiovascular: Denies chest pain. Respiratory: Denies shortness of breath. Gastrointestinal: No abdominal pain.   Musculoskeletal: Negative for back pain. Skin: Negative for rash.  ____________________________________________   PHYSICAL EXAM:  VITAL SIGNS: ED Triage Vitals [04/13/18 1210]  Enc Vitals Group     BP 112/80     Pulse Rate 74     Resp 16     Temp 97.7 F (36.5 C)     Temp Source Oral     SpO2 100 %     Weight 160 lb (72.6 kg)     Height  (1.575 m)     Head Circumference      Peak Flow      Pain Score 0     Pain Loc      Pain Edu?      Excl. in GC?     Constitutional: Alert and oriented. Well appearing and in no acute distress. Eyes: Conjunctivae are normal. PERRL. EOMI. Head: Atraumatic.  Mild bilateral frontal and maxillary sinus tenderness palpation.  No swelling. No erythema.  Ears: no erythema, normal TMs bilaterally.   Nose:Nasal congestion   Mouth/Throat: Mucous membranes are moist. Mild pharyngeal erythema. No tonsillar swelling or exudate.  Neck: No stridor.  No cervical spine tenderness to palpation. Hematological/Lymphatic/Immunilogical: No cervical lymphadenopathy. Cardiovascular: Normal rate, regular rhythm. Grossly normal heart sounds.  Good peripheral circulation. Respiratory: Normal respiratory effort.  No retractions. No wheezes, rales or rhonchi. Good air movement.  Musculoskeletal: Ambulatory with steady gait. No cervical, thoracic or lumbar tenderness to  palpation. Neurologic:  Normal speech and language. No gait instability. Skin:  Skin appears warm, dry and intact. No rash noted. Psychiatric: Mood and affect are normal. Speech and behavior are normal.  ___________________________________________   LABS (all labs ordered are listed, but only abnormal results are displayed)  Labs Reviewed - No data to display  PROCEDURES Procedures    INITIAL IMPRESSION / ASSESSMENT AND PLAN / ED COURSE  Pertinent labs & imaging results that were available during my care of the patient were reviewed by me and considered in my medical decision making (see chart for details).  Well-appearing patient.  No acute distress.  Suspect viral upper respiratory infection.  Encouraged over-the-counter decongestants and supportive care, parent Tessalon Perles and PRN Tussionex.  Suspect patient does have some current viral sinusitis, discussed  not clear bacterial at this time.  Rx hardcopy for oral Augmentin given to start after 3 days if symptoms continue, discussed reevaluation for  worsening concerns.  Encourage rest, fluids, supportive care.  Work note given.Discussed indication, risks and benefits of medications with patient.  Discussed follow up with Primary care physician this week. Discussed follow up and return parameters including no resolution or any worsening concerns. Patient verbalized understanding and agreed to plan.   ____________________________________________   FINAL CLINICAL IMPRESSION(S) / ED DIAGNOSES  Final diagnoses:  Viral URI with cough     ED Discharge Orders        Ordered    chlorpheniramine-HYDROcodone (TUSSIONEX PENNKINETIC ER) 10-8 MG/5ML SUER  At bedtime PRN     04/13/18 1253    benzonatate (TESSALON PERLES) 100 MG capsule  3 times daily PRN     04/13/18 1253    amoxicillin-clavulanate (AUGMENTIN) 875-125 MG tablet  Every 12 hours     04/13/18 1253       Note: This dictation was prepared with Dragon dictation along  with smaller phrase technology. Any transcriptional errors that result from this process are unintentional.         Renford Dills, NP 04/13/18 1336

## 2018-04-13 NOTE — ED Triage Notes (Signed)
Patient in today c/o 4 day history of nasal congestion that is getting worse and now going into her chest. Patient has tried OTC Benadryl, Dimatapp cold/allergy, Dayquil and Nyquil without relief. Patient denies fever.

## 2018-04-13 NOTE — Discharge Instructions (Signed)
Take medication as prescribed. Rest. Drink plenty of fluids.  ° °Follow up with your primary care physician this week as needed. Return to Urgent care for new or worsening concerns.  ° °

## 2018-07-06 ENCOUNTER — Other Ambulatory Visit: Payer: Self-pay | Admitting: Internal Medicine

## 2018-07-06 DIAGNOSIS — Z0000 Encounter for general adult medical examination without abnormal findings: Secondary | ICD-10-CM | POA: Diagnosis not present

## 2018-07-06 DIAGNOSIS — Z1231 Encounter for screening mammogram for malignant neoplasm of breast: Secondary | ICD-10-CM

## 2018-07-06 DIAGNOSIS — Z1239 Encounter for other screening for malignant neoplasm of breast: Secondary | ICD-10-CM | POA: Diagnosis not present

## 2018-12-07 DIAGNOSIS — H903 Sensorineural hearing loss, bilateral: Secondary | ICD-10-CM | POA: Diagnosis not present

## 2018-12-07 DIAGNOSIS — J301 Allergic rhinitis due to pollen: Secondary | ICD-10-CM | POA: Diagnosis not present

## 2019-01-04 DIAGNOSIS — R300 Dysuria: Secondary | ICD-10-CM | POA: Diagnosis not present

## 2019-01-04 DIAGNOSIS — E538 Deficiency of other specified B group vitamins: Secondary | ICD-10-CM | POA: Diagnosis not present

## 2019-01-04 DIAGNOSIS — K219 Gastro-esophageal reflux disease without esophagitis: Secondary | ICD-10-CM | POA: Diagnosis not present

## 2019-02-22 ENCOUNTER — Encounter: Payer: Self-pay | Admitting: Podiatry

## 2019-02-22 ENCOUNTER — Ambulatory Visit (INDEPENDENT_AMBULATORY_CARE_PROVIDER_SITE_OTHER): Payer: 59 | Admitting: Podiatry

## 2019-02-22 ENCOUNTER — Ambulatory Visit (INDEPENDENT_AMBULATORY_CARE_PROVIDER_SITE_OTHER): Payer: 59

## 2019-02-22 ENCOUNTER — Ambulatory Visit: Payer: No Typology Code available for payment source

## 2019-02-22 ENCOUNTER — Other Ambulatory Visit: Payer: Self-pay

## 2019-02-22 VITALS — Temp 98.9°F

## 2019-02-22 DIAGNOSIS — G5762 Lesion of plantar nerve, left lower limb: Secondary | ICD-10-CM

## 2019-02-22 DIAGNOSIS — G5763 Lesion of plantar nerve, bilateral lower limbs: Secondary | ICD-10-CM

## 2019-02-22 DIAGNOSIS — M21962 Unspecified acquired deformity of left lower leg: Secondary | ICD-10-CM

## 2019-02-22 DIAGNOSIS — M7741 Metatarsalgia, right foot: Secondary | ICD-10-CM | POA: Diagnosis not present

## 2019-02-22 DIAGNOSIS — M7742 Metatarsalgia, left foot: Secondary | ICD-10-CM | POA: Diagnosis not present

## 2019-02-22 DIAGNOSIS — M722 Plantar fascial fibromatosis: Secondary | ICD-10-CM

## 2019-02-22 NOTE — Progress Notes (Signed)
Subjective:  Patient ID: Christina Bates, female    DOB: 1966/02/21,  MRN: [id number]  Chief Complaint  Patient presents with  . Foot Pain    Patient presents today for painful left forefoot x months.  She reports that her foot constatly aches but pain becomes worse when walking.  "I feel like im walking on my bones and I don't have any cushion on my foot"  She has not done anything for treatment    53 y.o. female presents with the above complaint.  History above confirmed with patient  Review of Systems: Negative except as noted in the HPI. Denies N/V/F/Ch.  Past Medical History:  Diagnosis Date  . Anxiety   . Complication of anesthesia   . Hearing loss of both ears   . Heartburn   . HLD (hyperlipidemia)   . HSV infection   . PONV (postoperative nausea and vomiting)   . RLS (restless legs syndrome)    Occurs on occasion, better now.    Current Outpatient Medications:  .  ibuprofen (ADVIL,MOTRIN) 600 MG tablet, Take by mouth., Disp: , Rfl:  .  ALPRAZolam (XANAX) 0.25 MG tablet, , Disp: , Rfl:  .  cholecalciferol (VITAMIN D) 1000 UNITS tablet, Take 1,000 Units by mouth daily., Disp: , Rfl:  .  Cyanocobalamin (RA VITAMIN B-12 TR) 1000 MCG TBCR, Take by mouth., Disp: , Rfl:  .  estradiol (ESTRACE) 0.1 MG/GM vaginal cream, Place vaginally., Disp: , Rfl:  .  ferrous sulfate 325 (65 FE) MG tablet, Take 325 mg by mouth every other day. , Disp: , Rfl:  .  pantoprazole (PROTONIX) 40 MG tablet, 40 mg daily. , Disp: , Rfl: 2 .  valACYclovir (VALTREX) 1000 MG tablet, Take 1,000 mg by mouth every other day. , Disp: , Rfl: 3  Social History   Tobacco Use  Smoking Status Former Smoker  . Last attempt to quit: 11/12/2011  . Years since quitting: 7.2  Smokeless Tobacco Never Used    Allergies  Allergen Reactions  . Bee Venom   . Bacitracin Itching and Rash   Objective:   Vitals:   02/22/19 1408  Temp: 98.9 F (37.2 C)   There is no height or weight on file to calculate  BMI. Constitutional Well developed. Well nourished.  Vascular Dorsalis pedis pulses palpable bilaterally. Posterior tibial pulses palpable bilaterally. Capillary refill normal to all digits.  No cyanosis or clubbing noted. Pedal hair growth normal.  Neurologic Normal speech. Oriented to person, place, and time. Epicritic sensation to light touch grossly present bilaterally.  Dermatologic Nails well groomed and normal in appearance. No open wounds. Reactive hyperkeratosis second MPJ left plantarly  Orthopedic: POP L 2nd MPJ  POP L 3rd interspace with Mulder's click   Radiographs: Taken and reviewed. Elongated 2nd/3rd metatarsals. No acute fractures Assessment:   1. Plantar fasciitis   2. Morton neuroma, left   3. Metatarsal deformity, left    Plan:  Patient was evaluated and treated and all questions answered.  Interdigital Neuroma, left -Educated on etiology -Interspace injection delivered as below. -Educated on padding and proper shoegear  Procedure: Neuroma Injection Location: Left 3rd interspace Skin Prep: Alcohol. Injectate: 0.5 cc 0.5% marcaine plain, 0.5 cc dexamethasone phosphate. Disposition: Patient tolerated procedure well. Injection site dressed with a band-aid.  Metatarsal deformity left -Underlying second metatarsal with reactive hyperkeratosis -We will offload with custom molded inserts -Should orthotics felt immediate pain would benefit from second tarsal shortening osteotomy  Return in about 5 weeks (around  03/29/2019) for Capsulitis, Neuroma.

## 2019-02-22 NOTE — Addendum Note (Signed)
Addended by: Geraldine Contras D on: 02/22/2019 03:06 PM   Modules accepted: Orders

## 2019-03-14 DIAGNOSIS — R300 Dysuria: Secondary | ICD-10-CM | POA: Diagnosis not present

## 2019-03-16 DIAGNOSIS — R399 Unspecified symptoms and signs involving the genitourinary system: Secondary | ICD-10-CM | POA: Diagnosis not present

## 2019-03-29 ENCOUNTER — Ambulatory Visit (INDEPENDENT_AMBULATORY_CARE_PROVIDER_SITE_OTHER): Payer: 59 | Admitting: Podiatry

## 2019-03-29 ENCOUNTER — Ambulatory Visit: Payer: No Typology Code available for payment source | Admitting: Orthotics

## 2019-03-29 ENCOUNTER — Encounter: Payer: Self-pay | Admitting: Podiatry

## 2019-03-29 ENCOUNTER — Other Ambulatory Visit: Payer: Self-pay

## 2019-03-29 VITALS — Temp 98.0°F

## 2019-03-29 DIAGNOSIS — Q828 Other specified congenital malformations of skin: Secondary | ICD-10-CM | POA: Diagnosis not present

## 2019-03-29 DIAGNOSIS — G5762 Lesion of plantar nerve, left lower limb: Secondary | ICD-10-CM

## 2019-03-29 DIAGNOSIS — M722 Plantar fascial fibromatosis: Secondary | ICD-10-CM

## 2019-03-29 NOTE — Progress Notes (Signed)
Patient came in today to pick up custom made foot orthotics.  The goals were accomplished and the patient reported no dissatisfaction with said orthotics.  Patient was advised of breakin period and how to report any issues. 

## 2019-03-29 NOTE — Progress Notes (Signed)
She presents today for a follow-up of her capsulitis and neuroma left foot.  States that the pain is gone so much with exception of the the callus on the bottom of the foot she will see Raiford Noble today for orthotics per Dr. Samuella Cota last visit.  Objective: Vital signs stable alert and oriented x3 pulses are palpable.  She has no reproducible pain on palpation of the intermetatarsal spaces or of the metatarsal phalangeal joints.  Reactive hyperkeratotic lesion plantar aspect of the second metatarsal of the left foot is present I was able to do enucleate this today.  Assessment: Porokeratotic lesion plantar aspect second metatarsal left foot plantarflexed second metatarsal with capsulitis and possible neuroma second interdigital space.  Plan: Discussed etiology pathology and surgical therapies at this point time went ahead and debrided mechanically and chemically with Cantharone under occlusion to be washed off thoroughly tomorrow the reactive hyperkeratotic lesion subsecond left.  She was also seen by Raiford Noble for orthotics.  We will follow-up with her in 1 month.  She will call with questions or concerns.

## 2019-04-26 ENCOUNTER — Ambulatory Visit (INDEPENDENT_AMBULATORY_CARE_PROVIDER_SITE_OTHER): Payer: 59 | Admitting: Podiatry

## 2019-04-26 ENCOUNTER — Ambulatory Visit: Payer: 59 | Admitting: Orthotics

## 2019-04-26 ENCOUNTER — Encounter: Payer: Self-pay | Admitting: Podiatry

## 2019-04-26 ENCOUNTER — Other Ambulatory Visit: Payer: Self-pay

## 2019-04-26 VITALS — Temp 98.2°F

## 2019-04-26 DIAGNOSIS — Q828 Other specified congenital malformations of skin: Secondary | ICD-10-CM

## 2019-04-26 DIAGNOSIS — G5762 Lesion of plantar nerve, left lower limb: Secondary | ICD-10-CM

## 2019-04-26 DIAGNOSIS — M21962 Unspecified acquired deformity of left lower leg: Secondary | ICD-10-CM

## 2019-04-26 DIAGNOSIS — G5763 Lesion of plantar nerve, bilateral lower limbs: Secondary | ICD-10-CM

## 2019-04-26 DIAGNOSIS — M722 Plantar fascial fibromatosis: Secondary | ICD-10-CM

## 2019-04-26 NOTE — Progress Notes (Signed)
Removing met pads

## 2019-04-26 NOTE — Progress Notes (Signed)
She presents today for follow-up of porokeratosis and capsulitis of the left foot.  She states that injections seem to help but still have a little painful area on the bottom of the foot.  Objective: Vital signs are stable she alert and oriented x3 there is no erythema edema cellulitis drainage odor she has no pain on palpation of the metatarsal phalangeal joints.  Porokeratotic lesion subsecond metatarsal phalangeal joint of the left foot.  Appears to be a scar from previous surgeries.  Assessment: Painful lesion subsecond metatarsal phalangeal joint left foot resolving capsulitis.  Plan: Follow-up with [place] on an as-needed basis debrided the area today.

## 2019-05-10 ENCOUNTER — Other Ambulatory Visit: Payer: No Typology Code available for payment source | Admitting: Orthotics

## 2019-07-24 ENCOUNTER — Other Ambulatory Visit: Payer: Self-pay | Admitting: Physician Assistant

## 2019-07-24 DIAGNOSIS — Z1231 Encounter for screening mammogram for malignant neoplasm of breast: Secondary | ICD-10-CM

## 2019-08-25 ENCOUNTER — Ambulatory Visit
Admission: RE | Admit: 2019-08-25 | Discharge: 2019-08-25 | Disposition: A | Discharge status: Home / Self Care | Payer: 59 | Source: Ambulatory Visit | Attending: Physician Assistant | Admitting: Physician Assistant

## 2019-08-25 DIAGNOSIS — Z1231 Encounter for screening mammogram for malignant neoplasm of breast: Secondary | ICD-10-CM | POA: Diagnosis present

## 2019-09-25 NOTE — Progress Notes (Signed)
09/26/2019 4:04 PM   Christina Bates 20-Mar-1966 [id number]  Referring provider: Leotis Shames, MD [address] [place],  [place] [address]  Chief Complaint  Patient presents with  . Recurrent UTI    HPI: Christina Bates is a 53 year old female with rUTI's, mixed incontinence and vaginal atrophy who presents today for frequent UTI's and painful urination.  rUTI's Risk factors: age, vaginal atrophy and limited fluid intake.   + E.Coli pan sensitive in 04/26/2019  She is experiencing frequency, urgency and dysuria.  She states these symptoms first occurred at the end of September.  She was seen at [facility].  UA was nitrite positive, 10-50 WBC's, 4-10 RBC's and a few bacteria.  She was given Omnicef, but her symptoms did not improve.  No culture was performed.  She was called in another course of Omnicef and her symptoms still have not improved.  Nocturia and incontinence are baseline symptoms.  Patient denies any gross hematuria or suprapubic/flank pain.  Patient denies any fevers, chills, nausea or vomiting.   Her UA is positive for 11-30 WBC's.    Mixed incontinence Has been seen and evaluated by PT.  Not bothersome.   Vaginal atrophy Negative mammogram in 08/2019.  She has run out of the vaginal estrogen cream.    PMH: Past Medical History:  Diagnosis Date  . Anxiety   . Complication of anesthesia   . Hearing loss of both ears   . Heartburn   . HLD (hyperlipidemia)   . HSV infection   . PONV (postoperative nausea and vomiting)   . RLS (restless legs syndrome)    Occurs on occasion, better now.    Surgical History: Past Surgical History:  Procedure Laterality Date  . ABDOMINAL HYSTERECTOMY    . CESAREAN SECTION  1992 & 1993  . COCHLEAR IMPLANT Left 2004  . CYSTOSCOPY N/A 11/19/2015   Procedure: CYSTOSCOPY;  Surgeon: Conard Novak, MD;  Location: [facility];  Service: Gynecology;  Laterality: N/A;  .  DILATION AND CURETTAGE OF UTERUS    . ENDOMETRIAL ABLATION    . FOOT SURGERY Left 2011  . FOOT SURGERY Right 2008  . HYSTEROSCOPY    . LAPAROSCOPIC HYSTERECTOMY N/A 11/19/2015   Procedure: HYSTERECTOMY TOTAL LAPAROSCOPIC/BILATERAL SALPINGECTOMY;  Surgeon: Conard Novak, MD;  Location: [facility] OR[facility];  Service: Gynecology;  Laterality: N/A;  . TONSILLECTOMY  1972  . TUBAL LIGATION      Home Medications:  Allergies as of 09/26/2019      Reactions   Bee Venom    Bacitracin Itching, Rash      Medication List       Accurate as of September 26, 2019  4:04 PM. If you have any questions, ask your nurse or doctor.        ALPRAZolam 0.25 MG tablet Commonly known as: XANAX   cholecalciferol 1000 units tablet Commonly known as: VITAMIN D Take 1,000 Units by mouth daily.   estradiol 0.1 MG/GM vaginal cream Commonly known as: ESTRACE Place vaginally.   ferrous sulfate 325 (65 FE) MG tablet Take 325 mg by mouth every other day.   ibuprofen 600 MG tablet Commonly known as: ADVIL Take by mouth.   pantoprazole 40 MG tablet Commonly known as: PROTONIX 40 mg daily.   phenazopyridine 200 MG tablet Commonly known as: PYRIDIUM TK 1 T PO TID WC FOR 2 DAYS   RA Vitamin B-12 TR 1000 MCG Tbcr Generic drug:  Cyanocobalamin Take by mouth.   valACYclovir 1000 MG tablet Commonly known as: VALTREX Take 1,000 mg by mouth every other day.       Allergies:  Allergies  Allergen Reactions  . Bee Venom   . Bacitracin Itching and Rash    Family History: Family History  Problem Relation Age of Onset  . Hyperlipidemia Mother   . Hypertension Mother   . Hypertension Father   . Hyperlipidemia Father   . Kidney disease Neg Hx   . Bladder Cancer Neg Hx   . Breast cancer Neg Hx     Social History:  reports that she quit smoking about 7 years ago. She has never used smokeless tobacco. She reports current alcohol use. She reports that she does not use drugs.  ROS: UROLOGY Frequent  Urination?: Yes Hard to postpone urination?: Yes Burning/pain with urination?: Yes Get up at night to urinate?: Yes Leakage of urine?: Yes Urine stream starts and stops?: No Trouble starting stream?: No Do you have to strain to urinate?: No Blood in urine?: No Urinary tract infection?: No Sexually transmitted disease?: Yes Injury to kidneys or bladder?: No Painful intercourse?: Yes Weak stream?: No Currently pregnant?: No Vaginal bleeding?: No Last menstrual period?: n  Gastrointestinal Nausea?: No Vomiting?: No Indigestion/heartburn?: No Diarrhea?: No Constipation?: No  Constitutional Fever: No Night sweats?: No Weight loss?: No Fatigue?: No  Skin Skin rash/lesions?: No Itching?: No  Eyes Blurred vision?: No Double vision?: No  Ears/Nose/Throat Sore throat?: No Sinus problems?: No  Hematologic/Lymphatic Swollen glands?: No Easy bruising?: No  Cardiovascular Leg swelling?: No Chest pain?: No  Respiratory Cough?: No Shortness of breath?: No  Endocrine Excessive thirst?: No  Musculoskeletal Back pain?: No Joint pain?: No  Neurological Headaches?: No Dizziness?: No  Psychologic Depression?: No Anxiety?: Yes  Physical Exam: BP 96/63   Pulse 78   Ht 5\' 2"  (1.575 m)   Wt 161 lb (73 kg)   LMP 11/09/2015   BMI 29.45 kg/m   Constitutional:  Well nourished. Alert and oriented, No acute distress. HEENT: [place] AT, moist mucus membranes.  Trachea midline, no masses. Cardiovascular: No clubbing, cyanosis, or edema. Respiratory: Normal respiratory effort, no increased work of breathing. Neurologic: Grossly intact, no focal deficits, moving all 4 extremities. Psychiatric: Normal mood and affect.   Laboratory Data: Lab Results  Component Value Date   WBC 9.8 11/20/2015   HGB 13.2 11/20/2015   HCT 38.2 11/20/2015   MCV 91.0 11/20/2015   PLT 148 (L) 11/20/2015    Lab Results  Component Value Date   CREATININE 0.67 11/20/2015    No results  found for: PSA  No results found for: TESTOSTERONE  No results found for: HGBA1C  No results found for: TSH  No results found for: CHOL, HDL, CHOLHDL, VLDL, LDLCALC  Lab Results  Component Value Date   AST 18 11/12/2015   Lab Results  Component Value Date   ALT 13 (L) 11/12/2015   No components found for: ALKALINEPHOPHATASE No components found for: BILIRUBINTOTAL  No results found for: ESTRADIOL  Urinalysis Component     Latest Ref Rng & Units 09/26/2019  Specific Gravity, UA     1.005 - 1.030 >1.030 (H)  pH, UA     5.0 - 7.5 5.5  Color, UA     Yellow Yellow  Appearance Ur     Clear Cloudy (A)  Leukocytes,UA     Negative Trace (A)  Protein,UA     Negative/Trace Negative  Glucose, UA  Negative Negative  Ketones, UA     Negative Negative  RBC, UA     Negative Negative  Bilirubin, UA     Negative Negative  Urobilinogen, Ur     0.2 - 1.0 mg/dL 0.2  Nitrite, UA     Negative Negative  Microscopic Examination      See below:   Component     Latest Ref Rng & Units 09/26/2019  WBC, UA     0 - 5 /hpf 11-30 (A)  RBC     0 - 2 /hpf None seen  Epithelial Cells (non renal)     0 - 10 /hpf 0-10  Bacteria, UA     None seen/Few None seen   I have reviewed the labs.   Pertinent Imaging: Results for HADEEL, HILLEBRAND (MRN [id number]) as of 09/26/2019 16:49  Ref. Range 09/26/2019 15:30  Scan Result Unknown 66mL   I have independently reviewed the films.    Assessment & Plan:    1. rUTI's Explained to the patient and her husband that her recurring symptoms may be the result of not being placed on the appropriate antibiotics, we will send the urine for culture for conformation.  If urine culture is negative, will offer RUS and cystoscopy if still symptomatic  2. Vaginal atrophy Patient given a sample of Premarin cream and prescription is sent in I explained to the patient that when women go through menopause and her estrogen levels are severely diminished,  the normal vaginal flora will change.  This is due to an increase of the vaginal canal's pH. Because of this, the vaginal canal may be colonized by bacteria from the rectum instead of the protective lactobacillus.  This, accompanied by the loss of the mucus barrier with vaginal atrophy, is a cause of recurrent urinary tract infections.  In some studies, the use of vaginal estrogen cream has been demonstrated to reduce  recurrent urinary tract infections to one a year.  Instructed to apply 0.5mg  (pea-sized amount)  just inside the vaginal introitus with a finger-tip on Monday, Wednesday and Friday nights.    3. Mixed incontinence Not bothersome to patient at this time                                            Return for pending urine culture results .  These notes generated with voice recognition software. I apologize for typographical errors.  Zara Council, PA-C  [facility] [address]  [place], [place] [address] [phone]

## 2019-09-26 ENCOUNTER — Encounter: Payer: Self-pay | Admitting: Urology

## 2019-09-26 ENCOUNTER — Other Ambulatory Visit: Payer: Self-pay

## 2019-09-26 ENCOUNTER — Ambulatory Visit: Payer: 59 | Admitting: Urology

## 2019-09-26 VITALS — BP 96/63 | HR 78 | Ht 62.0 in | Wt 161.0 lb

## 2019-09-26 DIAGNOSIS — N952 Postmenopausal atrophic vaginitis: Secondary | ICD-10-CM

## 2019-09-26 DIAGNOSIS — N390 Urinary tract infection, site not specified: Secondary | ICD-10-CM | POA: Diagnosis not present

## 2019-09-26 DIAGNOSIS — N3946 Mixed incontinence: Secondary | ICD-10-CM | POA: Diagnosis not present

## 2019-09-26 LAB — BLADDER SCAN AMB NON-IMAGING

## 2019-09-27 LAB — URINALYSIS, COMPLETE
Bilirubin, UA: NEGATIVE
Glucose, UA: NEGATIVE
Ketones, UA: NEGATIVE
Nitrite, UA: NEGATIVE
Protein,UA: NEGATIVE
RBC, UA: NEGATIVE
Specific Gravity, UA: >1.03 — ABNORMAL HIGH (ref 1.005–1.030)
Urobilinogen, Ur: 0.2 mg/dL (ref 0.2–1.0)
pH, UA: 5.5 (ref 5.0–7.5)

## 2019-09-27 LAB — MICROSCOPIC EXAMINATION
Bacteria, UA: NONE SEEN
RBC, Urine: NONE SEEN /hpf (ref 0–2)

## 2019-09-28 LAB — CULTURE, URINE COMPREHENSIVE

## 2019-10-02 ENCOUNTER — Other Ambulatory Visit: Payer: Self-pay | Admitting: Urology

## 2019-10-02 ENCOUNTER — Telehealth: Payer: Self-pay

## 2019-10-02 ENCOUNTER — Telehealth: Payer: Self-pay | Admitting: Urology

## 2019-10-02 MED ORDER — SULFAMETHOXAZOLE-TRIMETHOPRIM 800-160 MG PO TABS: 1.0000 | ORAL_TABLET | Freq: Two times a day (BID) | ORAL | 0 refills | Status: DC

## 2019-10-02 NOTE — Telephone Encounter (Signed)
Informed patient husband of results and medication sent to pharmacy.

## 2019-10-02 NOTE — Telephone Encounter (Signed)
Pt.'s husband states pt. did not get a prescription called in that she discussed in office at her last appointment. Pt. States she received a sample of Estrace in office and was supposed to have it called in to pharmacy.

## 2019-10-02 NOTE — Telephone Encounter (Signed)
-----   Message from Nori Riis, PA-C sent at 09/29/2019  8:17 AM EST ----- Please let Mrs. Kempe know that her urine culture was positive for infection.  I would like her to start Septra DS, BID x 7 days.  I would like to see her in three weeks.

## 2019-10-02 NOTE — Telephone Encounter (Signed)
Would you confirm what pharmacy they would like the vaginal estrogen cream sent to?  Because when I go to prescribe it it states that it has already been ordered.

## 2019-10-03 ENCOUNTER — Other Ambulatory Visit: Payer: Self-pay | Admitting: Urology

## 2019-10-03 NOTE — Telephone Encounter (Signed)
[facility] [address] In [place]

## 2019-10-31 ENCOUNTER — Telehealth: Payer: Self-pay | Admitting: Urology

## 2019-10-31 ENCOUNTER — Other Ambulatory Visit: Payer: Self-pay | Admitting: Family Medicine

## 2019-10-31 MED ORDER — ESTRADIOL 0.1 MG/GM VA CREA: TOPICAL_CREAM | VAGINAL | 7 refills | Status: DC

## 2019-10-31 NOTE — Telephone Encounter (Signed)
Spoke to the pharmacist and the Estriol RX was sent dispense as written and the patient's insurance will not cover brand. I resent the RX.

## 2019-10-31 NOTE — Telephone Encounter (Signed)
Needs generic Christina Bates called into Pharmacy.

## 2019-12-21 ENCOUNTER — Encounter: Payer: Self-pay | Admitting: Urology

## 2019-12-21 ENCOUNTER — Ambulatory Visit: Payer: 59 | Admitting: Urology

## 2019-12-21 ENCOUNTER — Other Ambulatory Visit: Payer: Self-pay

## 2019-12-21 ENCOUNTER — Telehealth: Payer: Self-pay

## 2019-12-21 VITALS — BP 110/77 | HR 97 | Ht 62.0 in | Wt 161.0 lb

## 2019-12-21 DIAGNOSIS — N390 Urinary tract infection, site not specified: Secondary | ICD-10-CM | POA: Diagnosis not present

## 2019-12-21 DIAGNOSIS — N3946 Mixed incontinence: Secondary | ICD-10-CM | POA: Diagnosis not present

## 2019-12-21 DIAGNOSIS — N952 Postmenopausal atrophic vaginitis: Secondary | ICD-10-CM

## 2019-12-21 MED ORDER — CEPHALEXIN 500 MG PO CAPS: 500.0000 mg | ORAL_CAPSULE | Freq: Two times a day (BID) | ORAL | 0 refills | Status: DC

## 2019-12-21 NOTE — Telephone Encounter (Signed)
Patient's husband called stating that patient has a UTI and would like a script called in for an abx. It was explained that she would need to come into the office to see shannon and leave a UA before we could send in an abx, and may need to wait on culture results depending on UA today. She was added to Shannon's schedule today but patient does not get off work until [employer]. Husband was told that our last appointment for today was 3:30 and if she could not make that today we could see her tomorrow. Patient's husband was very upset with this answer and states he will try and contact wife to see if she can come in.

## 2019-12-21 NOTE — Progress Notes (Signed)
12/21/2019 3:53 PM   Christina Bates 07-11-1966 [id number]  Referring provider: Marinda Elk, MD [place] [facility],  [place] [address]  Chief Complaint  Patient presents with  . Dysuria    HPI: Christina Bates is a 54 year old female with rUTI's, mixed incontinence and vaginal atrophy who presents today for frequent UTI's and painful urination.  rUTI's Risk factors: age, vaginal atrophy and limited fluid intake.   + E.Coli pan sensitive in 04/26/2019  + E. Coli pan sensitive in 09/2019  This morning she started to experience dysuria and cloudy urine.  Patient denies any modifying or aggravating factors.  Patient denies any gross hematuria, dysuria or suprapubic/flank pain.  Patient denies any fevers, chills, nausea or vomiting.  UA yellow cloudy, dip negative and 11-30 WBC's with few bacteria microscopically.    Mixed incontinence Has been seen and evaluated by PT.  Not bothersome.   Vaginal atrophy Negative mammogram in 08/2019.  States that she has been using vaginal estrogen cream consistently for the exception of the last 2 weeks.  PMH: Past Medical History:  Diagnosis Date  . Anxiety   . Complication of anesthesia   . Hearing loss of both ears   . Heartburn   . HLD (hyperlipidemia)   . HSV infection   . PONV (postoperative nausea and vomiting)   . RLS (restless legs syndrome)    Occurs on occasion, better now.    Surgical History: Past Surgical History:  Procedure Laterality Date  . ABDOMINAL HYSTERECTOMY    . [place]  . COCHLEAR IMPLANT Left 2004  . CYSTOSCOPY N/A 11/19/2015   Procedure: CYSTOSCOPY;  Surgeon: Will Bonnet, MD;  Location: [facility] ORS;  Service: Gynecology;  Laterality: N/A;  . DILATION AND CURETTAGE OF UTERUS    . ENDOMETRIAL ABLATION    . FOOT SURGERY Left 2011  . FOOT SURGERY Right 2008  . HYSTEROSCOPY    . LAPAROSCOPIC HYSTERECTOMY N/A 11/19/2015   Procedure:  HYSTERECTOMY TOTAL LAPAROSCOPIC/BILATERAL SALPINGECTOMY;  Surgeon: Will Bonnet, MD;  Location: [facility] ORS;  Service: Gynecology;  Laterality: N/A;  . TONSILLECTOMY  1972  . TUBAL LIGATION      Home Medications:  Allergies as of 12/21/2019      Reactions   Bee Venom    Bacitracin Itching, Rash      Medication List       Accurate as of December 21, 2019  3:53 PM. If you have any questions, ask your nurse or doctor.        ALPRAZolam 0.25 MG tablet Commonly known as: XANAX   cephALEXin 500 MG capsule Commonly known as: Keflex Take 1 capsule (500 mg total) by mouth 2 (two) times daily. Started by: Zara Council, PA-C   cholecalciferol 1000 units tablet Commonly known as: VITAMIN D Take 1,000 Units by mouth daily.   estradiol 0.1 MG/GM vaginal cream Commonly known as: ESTRACE Dispense a pea size amount on Monday Wednesday and Friday   ferrous sulfate 325 (65 FE) MG tablet Take 325 mg by mouth every other day.   hyoscyamine 0.125 MG tablet Commonly known as: LEVSIN Take by mouth.   ibuprofen 600 MG tablet Commonly known as: ADVIL Take by mouth.   pantoprazole 40 MG tablet Commonly known as: PROTONIX 40 mg daily.   phenazopyridine 200 MG tablet Commonly known as: PYRIDIUM TK 1 T PO TID WC FOR 2 DAYS   RA Cranberry 500 MG Caps  Generic drug: Cranberry Take by mouth.   RA Vitamin B-12 TR 1000 MCG Tbcr Generic drug: Cyanocobalamin Take by mouth.   sulfamethoxazole-trimethoprim 800-160 MG tablet Commonly known as: BACTRIM DS Take 1 tablet by mouth 2 (two) times daily.   valACYclovir 1000 MG tablet Commonly known as: VALTREX Take 1,000 mg by mouth every other day.       Allergies:  Allergies  Allergen Reactions  . Bee Venom   . Bacitracin Itching and Rash    Family History: Family History  Problem Relation Age of Onset  . Hyperlipidemia Mother   . Hypertension Mother   . Hypertension Father   . Hyperlipidemia Father   . Kidney disease Neg  Hx   . Bladder Cancer Neg Hx   . Breast cancer Neg Hx     Social History:  reports that she quit smoking about 8 years ago. She has never used smokeless tobacco. She reports current alcohol use. She reports that she does not use drugs.  ROS: UROLOGY Frequent Urination?: No Hard to postpone urination?: No Burning/pain with urination?: Yes Get up at night to urinate?: No Leakage of urine?: No Urine stream starts and stops?: No Trouble starting stream?: No Do you have to strain to urinate?: No Blood in urine?: No Urinary tract infection?: Yes Sexually transmitted disease?: No Injury to kidneys or bladder?: No Painful intercourse?: No Weak stream?: No Currently pregnant?: No Vaginal bleeding?: No Last menstrual period?: n  Gastrointestinal Nausea?: No Vomiting?: No Indigestion/heartburn?: No Diarrhea?: No Constipation?: No  Constitutional Fever: No Night sweats?: No Weight loss?: No Fatigue?: No  Skin Skin rash/lesions?: No Itching?: No  Eyes Blurred vision?: No Double vision?: No  Ears/Nose/Throat Sore throat?: No Sinus problems?: No  Hematologic/Lymphatic Swollen glands?: No Easy bruising?: No  Cardiovascular Leg swelling?: No Chest pain?: No  Respiratory Cough?: No Shortness of breath?: No  Endocrine Excessive thirst?: No  Musculoskeletal Back pain?: No Joint pain?: No  Neurological Headaches?: No Dizziness?: No  Psychologic Depression?: No Anxiety?: No  Physical Exam: BP 110/77   Pulse 97   Ht '5\' 2"'$  (1.575 m)   Wt 161 lb (73 kg)   LMP 11/09/2015   BMI 29.45 kg/m   Constitutional:  Well nourished. Alert and oriented, No acute distress. HEENT: [place] AT, mask in place.  Trachea midline, no masses. Cardiovascular: No clubbing, cyanosis, or edema. Respiratory: Normal respiratory effort, no increased work of breathing. Neurologic: Grossly intact, no focal deficits, moving all 4 extremities. Psychiatric: Normal mood and affect.    Laboratory Data: Lab Results  Component Value Date   WBC 9.8 11/20/2015   HGB 13.2 11/20/2015   HCT 38.2 11/20/2015   MCV 91.0 11/20/2015   PLT 148 (L) 11/20/2015    Lab Results  Component Value Date   CREATININE 0.67 11/20/2015    No results found for: PSA  No results found for: TESTOSTERONE  No results found for: HGBA1C  No results found for: TSH  No results found for: CHOL, HDL, CHOLHDL, VLDL, LDLCALC  Lab Results  Component Value Date   AST 18 11/12/2015   Lab Results  Component Value Date   ALT 13 (L) 11/12/2015   No components found for: ALKALINEPHOPHATASE No components found for: BILIRUBINTOTAL  No results found for: ESTRADIOL  Urinalysis Component     Latest Ref Rng & Units 12/21/2019  Specific Gravity, UA     1.005 - 1.030 >1.030 (H)  pH, UA     5.0 - 7.5 5.0  Color,  UA     Yellow Yellow  Appearance Ur     Clear Cloudy (A)  Leukocytes,UA     Negative Negative  Protein,UA     Negative/Trace Negative  Glucose, UA     Negative Negative  Ketones, UA     Negative Negative  RBC, UA     Negative Negative  Bilirubin, UA     Negative Negative  Urobilinogen, Ur     0.2 - 1.0 mg/dL 1.0  Nitrite, UA     Negative Negative  Microscopic Examination      See below:   Component     Latest Ref Rng & Units 12/21/2019  WBC, UA     0 - 5 /hpf 11-30 (A)  RBC     0 - 2 /hpf None seen  Epithelial Cells (non renal)     0 - 10 /hpf 0-10  Casts     None seen /lpf Present (A)  Cast Type     N/A Hyaline casts  Bacteria, UA     None seen/Few Few   I have reviewed the labs.   Pertinent Imaging: Results for GREENLEE, ANCHETA (MRN [id number]) as of 09/26/2019 16:49  Ref. Range 09/26/2019 15:30  Scan Result Unknown 32m    Assessment & Plan:    1. rUTI's Criteria for recurrent UTI has been met with 2 or more infections in 6 months or 3 or greater infections in one year  Continue water intake until the urine is pale yellow or clear (10 to 12 cups  daily)  Continue probiotics (yogurt, oral pills or vaginal suppositories), take cranberry pills or drink the juice and Vitamin C 1,000 mg daily to acidify the urine  Continue to avoid soaking in tubs and wipe front to back after urinating  We will obtain renal ultrasound to rule out nidus for recurrent UTIs Urine is sent for culture and she is started on Keflex 500 mg, BID x 7 days                                      2. Vaginal atrophy Continue to apply vaginal estrogen cream Monday, Wednesday and Friday evenings  3. Mixed incontinence Not bothersome to patient at this time                                            Return for I will call patient with results.  These notes generated with voice recognition software. I apologize for typographical errors.  SZara Council PA-C  [facility] [address] [place] [phone]

## 2019-12-22 LAB — URINALYSIS, COMPLETE
Bilirubin, UA: NEGATIVE
Glucose, UA: NEGATIVE
Ketones, UA: NEGATIVE
Leukocytes,UA: NEGATIVE
Nitrite, UA: NEGATIVE
Protein,UA: NEGATIVE
RBC, UA: NEGATIVE
Specific Gravity, UA: >1.03 — ABNORMAL HIGH (ref 1.005–1.030)
Urobilinogen, Ur: 1 mg/dL (ref 0.2–1.0)
pH, UA: 5 (ref 5.0–7.5)

## 2019-12-22 LAB — MICROSCOPIC EXAMINATION: RBC, Urine: NONE SEEN /hpf (ref 0–2)

## 2019-12-24 LAB — CULTURE, URINE COMPREHENSIVE

## 2019-12-25 ENCOUNTER — Telehealth: Payer: Self-pay | Admitting: Family Medicine

## 2019-12-25 NOTE — Telephone Encounter (Signed)
-----   Message from Harle Battiest, PA-C sent at 12/25/2019  8:05 AM EST ----- Please let Christina Bates know that her urine culture was positive for infection and that the Keflex is the correct antibiotic.

## 2019-12-25 NOTE — Telephone Encounter (Signed)
Patient's husband notified and he will inform patient to complete ABX.

## 2019-12-28 ENCOUNTER — Ambulatory Visit
Admission: RE | Admit: 2019-12-28 | Discharge: 2019-12-28 | Disposition: A | Discharge status: Home / Self Care | Payer: 59 | Source: Ambulatory Visit | Attending: Urology | Admitting: Urology

## 2019-12-28 ENCOUNTER — Other Ambulatory Visit: Payer: Self-pay

## 2019-12-28 DIAGNOSIS — N390 Urinary tract infection, site not specified: Secondary | ICD-10-CM | POA: Diagnosis not present

## 2019-12-29 ENCOUNTER — Telehealth: Payer: Self-pay | Admitting: Family Medicine

## 2019-12-29 NOTE — Telephone Encounter (Signed)
Patient's husband notified and voiced understanding.

## 2019-12-29 NOTE — Telephone Encounter (Signed)
-----   Message from Harle Battiest, PA-C sent at 12/29/2019  7:55 AM EST ----- Please let Mrs. Six know that her ultrasound of her kidneys was normal.

## 2020-08-19 ENCOUNTER — Other Ambulatory Visit: Payer: Self-pay | Admitting: Physician Assistant

## 2020-08-19 DIAGNOSIS — Z1231 Encounter for screening mammogram for malignant neoplasm of breast: Secondary | ICD-10-CM

## 2020-09-01 ENCOUNTER — Other Ambulatory Visit: Payer: Self-pay

## 2020-09-01 ENCOUNTER — Ambulatory Visit
Admission: EM | Admit: 2020-09-01 | Discharge: 2020-09-01 | Disposition: A | Discharge status: Home / Self Care | Payer: 59 | Attending: Family Medicine | Admitting: Family Medicine

## 2020-09-01 DIAGNOSIS — N3001 Acute cystitis with hematuria: Secondary | ICD-10-CM | POA: Diagnosis present

## 2020-09-01 LAB — URINALYSIS, COMPLETE (UACMP) WITH MICROSCOPIC
Bilirubin Urine: NEGATIVE
Glucose, UA: NEGATIVE mg/dL
Ketones, ur: NEGATIVE mg/dL
Nitrite: NEGATIVE
Protein, ur: NEGATIVE mg/dL
Specific Gravity, Urine: 1.01 (ref 1.005–1.030)
WBC, UA: >50 WBC/hpf (ref 0–5)
pH: 6.5 (ref 5.0–8.0)

## 2020-09-01 MED ORDER — CEPHALEXIN 500 MG PO CAPS: 500.0000 mg | ORAL_CAPSULE | Freq: Two times a day (BID) | ORAL | 0 refills | Status: DC

## 2020-09-01 NOTE — Discharge Instructions (Addendum)
Antibiotic as prescribed.  Take care  Dr. Keilyn Nadal  

## 2020-09-01 NOTE — ED Triage Notes (Signed)
Patient presents to [facility] with husband who is helping patient due to being deaf. Patient states that she is having urinary frequency and burning x last night.

## 2020-09-01 NOTE — ED Provider Notes (Signed)
[facility]    CSN: [id number] Arrival date & time: 09/01/20  1317      History   Chief Complaint Chief Complaint  Patient presents with  . Dysuria    HPI  54 year old female presents with concerns for UTI.  Patient states that she has had symptoms for the past 2 to 3 days but worsened last night.  She reports urinary frequency and dysuria.  No fever.  She does note some associated lower abdominal pain.  No relieving factors.  Has a history of recurrent UTI.  No other complaints at this time.  Past Medical History:  Diagnosis Date  . Anxiety   . Complication of anesthesia   . Hearing loss of both ears   . Heartburn   . HLD (hyperlipidemia)   . HSV infection   . PONV (postoperative nausea and vomiting)   . RLS (restless legs syndrome)    Occurs on occasion, better now.    Patient Active Problem List   Diagnosis Date Noted  . Dizziness 02/26/2018  . Degenerative joint disease of cervical and lumbar spine 02/23/2018  . Lumbar radiculopathy 09/17/2017  . Pure hypercholesterolemia 03/24/2016  . Acute neck pain 12/31/2015  . Acute pain of left hip 12/31/2015  . Anxiety 12/31/2015  . Menorrhagia with irregular cycle 11/19/2015  . Dysmenorrhea 11/19/2015  . Status post laparoscopic hysterectomy 11/19/2015  . Recurrent genital herpes 08/15/2015  . Vitamin B 12 deficiency 08/15/2015  . Low vitamin B12 level 03/20/2015  . Vitamin D deficiency 10/06/2014  . Absolute anemia 10/05/2014  . Gastro-esophageal reflux disease without esophagitis 10/05/2014    Past Surgical History:  Procedure Laterality Date  . ABDOMINAL HYSTERECTOMY    . CESAREAN SECTION  1992 & 1993  . COCHLEAR IMPLANT Left 2004  . CYSTOSCOPY N/A 11/19/2015   Procedure: CYSTOSCOPY;  Surgeon: Conard Novak, MD;  Location: [facility] ORS;  Service: Gynecology;  Laterality: N/A;  . DILATION AND CURETTAGE OF UTERUS    . ENDOMETRIAL ABLATION    . FOOT SURGERY Left 2011  . FOOT SURGERY Right 2008    . HYSTEROSCOPY    . LAPAROSCOPIC HYSTERECTOMY N/A 11/19/2015   Procedure: HYSTERECTOMY TOTAL LAPAROSCOPIC/BILATERAL SALPINGECTOMY;  Surgeon: Conard Novak, MD;  Location: [facility] ORS;  Service: Gynecology;  Laterality: N/A;  . TONSILLECTOMY  1972  . TUBAL LIGATION      OB History   No obstetric history on file.      Home Medications    Prior to Admission medications   Medication Sig Start Date End Date Taking? Authorizing Provider  ALPRAZolam Prudy Feeler) 0.25 MG tablet  12/31/15  Yes [provider]  cholecalciferol (VITAMIN D) 1000 UNITS tablet Take 1,000 Units by mouth daily.   Yes [provider]  Cranberry (RA CRANBERRY) 500 MG CAPS Take by mouth.   Yes [provider]  Cyanocobalamin (RA VITAMIN B-12 TR) 1000 MCG TBCR Take by mouth.   Yes [provider]  estradiol (ESTRACE) 0.1 MG/GM vaginal cream Dispense a pea size amount on Monday Wednesday and Friday 10/31/19  Yes McGowan, Carollee Herter A, PA-C  ferrous sulfate 325 (65 FE) MG tablet Take 325 mg by mouth every other day.    Yes [provider]  hyoscyamine (LEVSIN) 0.125 MG tablet Take by mouth. 12/20/19 09/01/20 Yes [provider]  ibuprofen (ADVIL,MOTRIN) 600 MG tablet Take by mouth. 11/20/15  Yes [provider]  pantoprazole (PROTONIX) 40 MG tablet 40 mg daily.  02/05/15  Yes [provider]  valACYclovir (VALTREX) 1000 MG tablet Take 1,000 mg by mouth every other day.  03/09/15  Yes [provider]  cephALEXin (KEFLEX) 500 MG capsule Take 1 capsule (500 mg total) by mouth 2 (two) times daily. 09/01/20   Tommie Sams, DO    Family History Family History  Problem Relation Age of Onset  . Hyperlipidemia Mother   . Hypertension Mother   . Hypertension Father   . Hyperlipidemia Father   . Kidney disease Neg Hx   . Bladder Cancer Neg Hx   . Breast cancer Neg Hx     Social History Social History   Tobacco Use  . Smoking status: Former Smoker     Quit date: 11/12/2011    Years since quitting: 8.8  . Smokeless tobacco: Never Used  Vaping Use  . Vaping Use: Former  Substance Use Topics  . Alcohol use: Yes    Alcohol/week: 0.0 standard drinks    Comment: 3-5 drinks per year  . Drug use: No     Allergies   Bee venom, Bacitracin, and Bactrim [sulfamethoxazole-trimethoprim]   Review of Systems Review of Systems  Gastrointestinal: Positive for abdominal pain.  Genitourinary: Positive for dysuria and frequency.   Physical Exam Triage Vital Signs ED Triage Vitals  Enc Vitals Group     BP 09/01/20 1338 109/85     Pulse Rate 09/01/20 1338 69     Resp 09/01/20 1338 18     Temp 09/01/20 1338 98.6 F (37 C)     Temp Source 09/01/20 1338 Oral     SpO2 09/01/20 1338 100 %     Weight 09/01/20 1336 160 lb 15 oz (73 kg)     Height 09/01/20 1336 5\' 2"  (1.575 m)     Head Circumference --      Peak Flow --      Pain Score 09/01/20 1336 8     Pain Loc --      Pain Edu? --      Excl. in GC? --     Updated Vital Signs BP 109/85 (BP Location: Left Arm)   Pulse 69   Temp 98.6 F (37 C) (Oral)   Resp 18   Ht 5\' 2"  (1.575 m)   Wt 73 kg   LMP 11/09/2015   SpO2 100%   BMI 29.44 kg/m   Visual Acuity Right Eye Distance:   Left Eye Distance:   Bilateral Distance:    Right Eye Near:   Left Eye Near:    Bilateral Near:     Physical Exam Vitals and nursing note reviewed.  Constitutional:      General: She is not in acute distress.    Appearance: Normal appearance. She is not ill-appearing.  HENT:     Head: Normocephalic and atraumatic.  Cardiovascular:     Rate and Rhythm: Normal rate and regular rhythm.     Heart sounds: No murmur heard.   Pulmonary:     Effort: Pulmonary effort is normal.     Breath sounds: Normal breath sounds. No wheezing, rhonchi or rales.  Abdominal:     Palpations: Abdomen is soft.     Tenderness: There is no abdominal tenderness.  Neurological:     Mental Status: She is alert.    Psychiatric:        Mood and Affect: Mood normal.        Behavior: Behavior normal.    UC Treatments / Results  Labs (all labs ordered are listed, but  only abnormal results are displayed) Labs Reviewed  URINALYSIS, COMPLETE (UACMP) WITH MICROSCOPIC - Abnormal; Notable for the following components:      Result Value   APPearance HAZY (*)    Hgb urine dipstick MODERATE (*)    Leukocytes,Ua LARGE (*)    Bacteria, UA MANY (*)    All other components within normal limits  URINE CULTURE    EKG   Radiology No results found.  Procedures Procedures (including critical care time)  Medications Ordered in UC Medications - No data to display  Initial Impression / Assessment and Plan / UC Course  I have reviewed the triage vital signs and the nursing notes.  Pertinent labs & imaging results that were available during my care of the patient were reviewed by me and considered in my medical decision making (see chart for details).    54 year old female presents with UTI.  Has recurrent UTI.  Treating with Keflex.  Awaiting culture.  Final Clinical Impressions(s) / UC Diagnoses   Final diagnoses:  Acute cystitis with hematuria     Discharge Instructions     Antibiotic as prescribed.  Take care  Dr. Adriana Simas    ED Prescriptions    Medication Sig Dispense Auth. Provider   cephALEXin (KEFLEX) 500 MG capsule Take 1 capsule (500 mg total) by mouth 2 (two) times daily. 14 capsule Everlene Other G, DO     PDMP not reviewed this encounter.   Tommie Sams, DO 09/01/20 1409

## 2020-09-03 LAB — URINE CULTURE: Culture: >=100000 — AB

## 2020-09-11 ENCOUNTER — Other Ambulatory Visit: Payer: Self-pay

## 2020-09-11 ENCOUNTER — Ambulatory Visit
Admission: RE | Admit: 2020-09-11 | Discharge: 2020-09-11 | Disposition: A | Discharge status: Home / Self Care | Payer: 59 | Source: Ambulatory Visit | Attending: Physician Assistant | Admitting: Physician Assistant

## 2020-09-11 DIAGNOSIS — Z1231 Encounter for screening mammogram for malignant neoplasm of breast: Secondary | ICD-10-CM | POA: Diagnosis present

## 2020-09-14 ENCOUNTER — Other Ambulatory Visit: Payer: Self-pay

## 2020-09-14 ENCOUNTER — Encounter: Payer: Self-pay | Admitting: Emergency Medicine

## 2020-09-14 ENCOUNTER — Ambulatory Visit
Admission: EM | Admit: 2020-09-14 | Discharge: 2020-09-14 | Disposition: A | Discharge status: Home / Self Care | Payer: 59 | Attending: Family Medicine | Admitting: Family Medicine

## 2020-09-14 DIAGNOSIS — N3000 Acute cystitis without hematuria: Secondary | ICD-10-CM | POA: Diagnosis present

## 2020-09-14 LAB — URINALYSIS, COMPLETE (UACMP) WITH MICROSCOPIC
Bilirubin Urine: NEGATIVE
Glucose, UA: NEGATIVE mg/dL
Hgb urine dipstick: NEGATIVE
Ketones, ur: NEGATIVE mg/dL
Nitrite: NEGATIVE
Protein, ur: NEGATIVE mg/dL
Specific Gravity, Urine: 1.025 (ref 1.005–1.030)
pH: 5 (ref 5.0–8.0)

## 2020-09-14 MED ORDER — CEFDINIR 300 MG PO CAPS: 300.0000 mg | ORAL_CAPSULE | Freq: Two times a day (BID) | ORAL | 0 refills | Status: DC

## 2020-09-14 NOTE — ED Triage Notes (Signed)
Patient treated for UTI 2 weeks ago.  Patient states that her symptoms never completely went away.  Patient c/o burning when urinating and urinary frequency.

## 2020-09-14 NOTE — Discharge Instructions (Signed)
Medication as prescribed.  If she continues to have trouble, I would recommend seeing Urology.  Take care  Dr. Adriana Simas

## 2020-09-14 NOTE — ED Provider Notes (Signed)
[facility]    CSN: [id number] Arrival date & time: 09/14/20  1318      History   Chief Complaint Chief Complaint  Patient presents with   Dysuria   Urinary Frequency   HPI  54 year old female presents with persistent urinary symptoms.  Patient seen on 10/10.  Diagnosed with UTI.  Culture grew out pansensitive E. coli.  Was treated with Keflex.  Patient reports that she took the antibiotic as prescribed.  Patient states that she continues to have urinary symptoms.  She feels like she has never gotten better.  She reports dysuria and urinary frequency.  Pain 6/10 in severity.  No relieving factors.  No fever.  She does note some lower abdominal pain.  No other associated symptoms.  No other complaints.  Past Medical History:  Diagnosis Date   Anxiety    Complication of anesthesia    Hearing loss of both ears    Heartburn    HLD (hyperlipidemia)    HSV infection    PONV (postoperative nausea and vomiting)    RLS (restless legs syndrome)    Occurs on occasion, better now.    Patient Active Problem List   Diagnosis Date Noted   Dizziness 02/26/2018   Degenerative joint disease of cervical and lumbar spine 02/23/2018   Lumbar radiculopathy 09/17/2017   Pure hypercholesterolemia 03/24/2016   Acute neck pain 12/31/2015   Acute pain of left hip 12/31/2015   Anxiety 12/31/2015   Menorrhagia with irregular cycle 11/19/2015   Dysmenorrhea 11/19/2015   Status post laparoscopic hysterectomy 11/19/2015   Recurrent genital herpes 08/15/2015   Vitamin B 12 deficiency 08/15/2015   Low vitamin B12 level 03/20/2015   Vitamin D deficiency 10/06/2014   Absolute anemia 10/05/2014   Gastro-esophageal reflux disease without esophagitis 10/05/2014    Past Surgical History:  Procedure Laterality Date   ABDOMINAL HYSTERECTOMY     CESAREAN SECTION  1992 & 1993   COCHLEAR IMPLANT Left 2004   CYSTOSCOPY N/A 11/19/2015   Procedure: CYSTOSCOPY;   Surgeon: Conard Novak, MD;  Location: [facility];  Service: Gynecology;  Laterality: N/A;   DILATION AND CURETTAGE OF UTERUS     ENDOMETRIAL ABLATION     FOOT SURGERY Left 2011   FOOT SURGERY Right 2008   HYSTEROSCOPY     LAPAROSCOPIC HYSTERECTOMY N/A 11/19/2015   Procedure: HYSTERECTOMY TOTAL LAPAROSCOPIC/BILATERAL SALPINGECTOMY;  Surgeon: Conard Novak, MD;  Location: [facility];  Service: Gynecology;  Laterality: N/A;   TONSILLECTOMY  1972   TUBAL LIGATION      OB History   No obstetric history on file.      Home Medications    Prior to Admission medications   Medication Sig Start Date End Date Taking? Authorizing Provider  ALPRAZolam Prudy Feeler) 0.25 MG tablet  12/31/15  Yes [provider]  cholecalciferol (VITAMIN D) 1000 UNITS tablet Take 1,000 Units by mouth daily.   Yes [provider]  Cranberry (RA CRANBERRY) 500 MG CAPS Take by mouth.   Yes [provider]  Cyanocobalamin (RA VITAMIN B-12 TR) 1000 MCG TBCR Take by mouth.   Yes [provider]  estradiol (ESTRACE) 0.1 MG/GM vaginal cream Dispense a pea size amount on Monday Wednesday and Friday 10/31/19  Yes McGowan, Carollee Herter A, PA-C  ferrous sulfate 325 (65 FE) MG tablet Take 325 mg by mouth every other day.    Yes [provider]  ibuprofen (ADVIL,MOTRIN) 600 MG tablet Take by mouth. 11/20/15  Yes [provider]  pantoprazole (PROTONIX) 40 MG tablet 40 mg daily.  02/05/15  Yes [provider]  cefdinir (OMNICEF) 300 MG capsule Take 1 capsule (300 mg total) by mouth 2 (two) times daily. 09/14/20   Tommie Sams, DO  hyoscyamine (LEVSIN) 0.125 MG tablet Take by mouth. 12/20/19 09/01/20  [provider]  valACYclovir (VALTREX) 1000 MG tablet Take 1,000 mg by mouth every other day.  03/09/15   [provider]    Family History Family History  Problem Relation Age of Onset   Hyperlipidemia Mother    Hypertension Mother     Hypertension Father    Hyperlipidemia Father    Kidney disease Neg Hx    Bladder Cancer Neg Hx    Breast cancer Neg Hx     Social History Social History   Tobacco Use   Smoking status: Former Smoker    Quit date: 11/12/2011    Years since quitting: 8.8   Smokeless tobacco: Never Used  Vaping Use   Vaping Use: Former  Substance Use Topics   Alcohol use: Yes    Alcohol/week: 0.0 standard drinks    Comment: 3-5 drinks per year   Drug use: No     Allergies   Bee venom, Bacitracin, and Bactrim [sulfamethoxazole-trimethoprim]   Review of Systems Review of Systems  Gastrointestinal: Positive for abdominal pain.  Genitourinary: Positive for dysuria and frequency.   Physical Exam Triage Vital Signs ED Triage Vitals  Enc Vitals Group     BP 09/14/20 1358 119/82     Pulse Rate 09/14/20 1358 74     Resp 09/14/20 1358 14     Temp 09/14/20 1358 98.2 F (36.8 C)     Temp Source 09/14/20 1358 Oral     SpO2 09/14/20 1358 99 %     Weight 09/14/20 1355 160 lb 15 oz (73 kg)     Height 09/14/20 1355 5\' 2"  (1.575 m)     Head Circumference --      Peak Flow --      Pain Score 09/14/20 1355 6     Pain Loc --      Pain Edu? --      Excl. in GC? --    Updated Vital Signs BP 119/82 (BP Location: Left Arm)    Pulse 74    Temp 98.2 F (36.8 C) (Oral)    Resp 14    Ht 5\' 2"  (1.575 m)    Wt 73 kg    LMP 11/09/2015    SpO2 99%    BMI 29.44 kg/m   Visual Acuity Right Eye Distance:   Left Eye Distance:   Bilateral Distance:    Right Eye Near:   Left Eye Near:    Bilateral Near:     Physical Exam Vitals and nursing note reviewed.  Constitutional:      General: She is not in acute distress.    Appearance: Normal appearance. She is not ill-appearing.  HENT:     Head: Normocephalic and atraumatic.  Eyes:     General:        Right eye: No discharge.        Left eye: No discharge.     Conjunctiva/sclera: Conjunctivae normal.  Cardiovascular:     Rate and Rhythm:  Normal rate and regular rhythm.     Heart sounds: No murmur heard.   Pulmonary:     Effort: Pulmonary effort is normal.     Breath sounds: Normal breath sounds.  No wheezing, rhonchi or rales.  Abdominal:     General: There is no distension.     Palpations: Abdomen is soft.     Comments: Mild tenderness in the suprapubic region.  Neurological:     Mental Status: She is alert.  Psychiatric:        Mood and Affect: Mood normal.        Behavior: Behavior normal.    UC Treatments / Results  Labs (all labs ordered are listed, but only abnormal results are displayed) Labs Reviewed  URINALYSIS, COMPLETE (UACMP) WITH MICROSCOPIC - Abnormal; Notable for the following components:      Result Value   APPearance HAZY (*)    Leukocytes,Ua SMALL (*)    Bacteria, UA RARE (*)    All other components within normal limits  URINE CULTURE    EKG   Radiology No results found.  Procedures Procedures (including critical care time)  Medications Ordered in UC Medications - No data to display  Initial Impression / Assessment and Plan / UC Course  I have reviewed the triage vital signs and the nursing notes.  Pertinent labs & imaging results that were available during my care of the patient were reviewed by me and considered in my medical decision making (see chart for details).    55 year old female presents with UTI.  Sending culture.  Placing on Omnicef.  Final Clinical Impressions(s) / UC Diagnoses   Final diagnoses:  Acute cystitis without hematuria     Discharge Instructions     Medication as prescribed.  If she continues to have trouble, I would recommend seeing Urology.  Take care  Dr. Adriana Simas     ED Prescriptions    Medication Sig Dispense Auth. Provider   cefdinir (OMNICEF) 300 MG capsule Take 1 capsule (300 mg total) by mouth 2 (two) times daily. 20 capsule Tommie Sams, DO     PDMP not reviewed this encounter.   Tommie Sams, [place] 09/14/20 1601

## 2020-09-17 LAB — URINE CULTURE: Culture: >=100000 — AB

## 2020-10-04 ENCOUNTER — Telehealth: Payer: Self-pay | Admitting: Physician Assistant

## 2020-10-04 ENCOUNTER — Encounter: Payer: Self-pay | Admitting: Physician Assistant

## 2020-10-04 ENCOUNTER — Ambulatory Visit (INDEPENDENT_AMBULATORY_CARE_PROVIDER_SITE_OTHER): Payer: 59 | Admitting: Physician Assistant

## 2020-10-04 ENCOUNTER — Other Ambulatory Visit: Payer: Self-pay

## 2020-10-04 VITALS — BP 114/75 | HR 73 | Temp 97.6°F | Ht 62.0 in | Wt 162.0 lb

## 2020-10-04 DIAGNOSIS — N390 Urinary tract infection, site not specified: Secondary | ICD-10-CM | POA: Diagnosis not present

## 2020-10-04 LAB — BLADDER SCAN AMB NON-IMAGING: Scan Result: 0

## 2020-10-04 MED ORDER — CIPROFLOXACIN HCL 250 MG PO TABS: 250.0000 mg | ORAL_TABLET | Freq: Two times a day (BID) | ORAL | 0 refills | Status: AC

## 2020-10-04 NOTE — Patient Instructions (Addendum)
It looks like you have another UTI today. I'm starting you on antibiotics based on your recent urine culture information. I will send your urine for culture again today and call you if I need to switch your antibiotics based on the results; otherwise, I will not call you.  Please continue topical vaginal estrogen cream three times weekly. Please start a daily probiotic containing lactobacillus. Please try another brand of cranberry supplements for urinary tract health, and stop these if you develop another rash.  I would like you to come back to the office for a cystoscopy to look inside your bladder given your history of persistent UTI symptoms.   Cystoscopy Cystoscopy is a procedure that is used to help diagnose and sometimes treat conditions that affect the lower urinary tract. The lower urinary tract includes the bladder and the urethra. The urethra is the tube that drains urine from the bladder. Cystoscopy is done using a thin, tube-shaped instrument with a light and camera at the end (cystoscope). The cystoscope may be hard or flexible, depending on the goal of the procedure. The cystoscope is inserted through the urethra, into the bladder. Cystoscopy may be recommended if you have:  Urinary tract infections that keep coming back.  Blood in the urine (hematuria).  An inability to control when you urinate (urinary incontinence) or an overactive bladder.  Unusual cells found in a urine sample.  A blockage in the urethra, such as a urinary stone.  Painful urination.  An abnormality in the bladder found during an intravenous pyelogram (IVP) or CT scan. Cystoscopy may also be done to remove a sample of tissue to be examined under a microscope (biopsy). Tell a health care provider about:  Any allergies you have.  All medicines you are taking, including vitamins, herbs, eye drops, creams, and over-the-counter medicines.  Any problems you or family members have had with anesthetic  medicines.  Any blood disorders you have.  Any surgeries you have had.  Any medical conditions you have.  Whether you are pregnant or may be pregnant. What are the risks? Generally, this is a safe procedure. However, problems may occur, including:  Infection.  Bleeding.  Allergic reactions to medicines.  Damage to other structures or organs. What happens before the procedure?  Ask your health care provider about: ? Changing or stopping your regular medicines. This is especially important if you are taking diabetes medicines or blood thinners. ? Taking medicines such as aspirin and ibuprofen. These medicines can thin your blood. Do not take these medicines unless your health care provider tells you to take them. ? Taking over-the-counter medicines, vitamins, herbs, and supplements.  Follow instructions from your health care provider about eating or drinking restrictions.  Ask your health care provider what steps will be taken to help prevent infection. These may include: ? Washing skin with a germ-killing soap. ? Taking antibiotic medicine.  You may have an exam or testing, such as: ? X-rays of the bladder, urethra, or kidneys. ? Urine tests to check for signs of infection.  Plan to have someone take you home from the hospital or clinic. What happens during the procedure?   You will be given one or more of the following: ? A medicine to help you relax (sedative). ? A medicine to numb the area (local anesthetic).  The area around the opening of your urethra will be cleaned.  The cystoscope will be passed through your urethra into your bladder.  Germ-free (sterile) fluid will flow through the  cystoscope to fill your bladder. The fluid will stretch your bladder so that your health care provider can clearly examine your bladder walls.  Your doctor will look at the urethra and bladder. Your doctor may take a biopsy or remove stones.  The cystoscope will be removed, and  your bladder will be emptied. The procedure may vary among health care providers and hospitals. What can I expect after the procedure? After the procedure, it is common to have:  Some soreness or pain in your abdomen and urethra.  Urinary symptoms. These include: ? Mild pain or burning when you urinate. Pain should stop within a few minutes after you urinate. This may last for up to 1 week. ? A small amount of blood in your urine for several days. ? Feeling like you need to urinate but producing only a small amount of urine. Follow these instructions at home: Medicines  Take over-the-counter and prescription medicines only as told by your health care provider.  If you were prescribed an antibiotic medicine, take it as told by your health care provider. Do not stop taking the antibiotic even if you start to feel better. General instructions  Return to your normal activities as told by your health care provider. Ask your health care provider what activities are safe for you.  Do not drive for 24 hours if you were given a sedative during your procedure.  Watch for any blood in your urine. If the amount of blood in your urine increases, call your health care provider.  Follow instructions from your health care provider about eating or drinking restrictions.  If a tissue sample was removed for testing (biopsy) during your procedure, it is up to you to get your test results. Ask your health care provider, or the department that is doing the test, when your results will be ready.  Drink enough fluid to keep your urine pale yellow.  Keep all follow-up visits as told by your health care provider. This is important. Contact a health care provider if you:  Have pain that gets worse or does not get better with medicine, especially pain when you urinate.  Have trouble urinating.  Have more blood in your urine. Get help right away if you:  Have blood clots in your urine.  Have abdominal  pain.  Have a fever or chills.  Are unable to urinate. Summary  Cystoscopy is a procedure that is used to help diagnose and sometimes treat conditions that affect the lower urinary tract.  Cystoscopy is done using a thin, tube-shaped instrument with a light and camera at the end.  After the procedure, it is common to have some soreness or pain in your abdomen and urethra.  Watch for any blood in your urine. If the amount of blood in your urine increases, call your health care provider.  If you were prescribed an antibiotic medicine, take it as told by your health care provider. Do not stop taking the antibiotic even if you start to feel better. This information is not intended to replace advice given to you by your health care provider. Make sure you discuss any questions you have with your health care provider. Document Revised: 11/01/2018 Document Reviewed: 11/01/2018 Elsevier Patient Education  2020 [employer].

## 2020-10-04 NOTE — Telephone Encounter (Signed)
Patient's husband called the office today to request a same day appointment for his wife.  (She is hearing impaired.).  She has been dealing with a UTI for a little over a month.  She has been on 2 different antibiotics, but is still having painful urination.  I booked a same day appointment with Sam at 2:30pm today.

## 2020-10-04 NOTE — Progress Notes (Signed)
10/04/2020 3:28 PM   Christina Bates 05/28/1966 [id number]  CC: Chief Complaint  Patient presents with  . Dysuria    HPI: Christina Bates is a 54 y.o. female with a history of recurrent UTI, mixed incontinence, and vaginal atrophy on topical vaginal estrogen cream who presents today for evaluation of possible UTI.   She has been seen in the emergency department twice in the past month for evaluation of urinary symptoms including dysuria and urinary frequency.  UA on 09/01/2020 notable for >50 WBCs/hpf, 6-10 RBCs/hpf, and many bacteria; urine culture resulted with pansensitive E. coli and she was treated with Keflex 500 mg twice daily x7 days.  On 09/14/2020, UA was notable for 21-50 WBCs/hpf and rare bacteria; urine culture resulted with pansensitive E. coli again and she was treated with Omnicef 300 mg twice daily x10 days.  She underwent renal ultrasound on 12/29/2019 with no significant findings.  Today she reports that her dysuria and frequency never fully resolved on Keflex.  She states these did resolve on Omnicef (completed 11 days ago) but her dysuria returned yesterday.  She states she has never had back-to-back UTIs like this before.  She continues to take topical vaginal estrogen cream 1-2 times weekly.  She was previously on cranberry supplements, however had to change brands due to supply issues and developed a rash on Azo brand cranberry supplements.  She denies fecaluria or pneumaturia.  In-office UA today positive for 2+ blood and 1+ leukocyte esterase; urine microscopy with >30 WBCs/HPF, 3-10 RBCs/HPF, and few bacteria. PVR 85mL.  PMH: Past Medical History:  Diagnosis Date  . Anxiety   . Complication of anesthesia   . Hearing loss of both ears   . Heartburn   . HLD (hyperlipidemia)   . HSV infection   . PONV (postoperative nausea and vomiting)   . RLS (restless legs syndrome)    Occurs on occasion, better now.    Surgical History: Past Surgical History:   Procedure Laterality Date  . ABDOMINAL HYSTERECTOMY    . CESAREAN SECTION  1992 & 1993  . COCHLEAR IMPLANT Left 2004  . CYSTOSCOPY N/A 11/19/2015   Procedure: CYSTOSCOPY;  Surgeon: Conard Novak, MD;  Location: [facility];  Service: Gynecology;  Laterality: N/A;  . DILATION AND CURETTAGE OF UTERUS    . ENDOMETRIAL ABLATION    . FOOT SURGERY Left 2011  . FOOT SURGERY Right 2008  . HYSTEROSCOPY    . LAPAROSCOPIC HYSTERECTOMY N/A 11/19/2015   Procedure: HYSTERECTOMY TOTAL LAPAROSCOPIC/BILATERAL SALPINGECTOMY;  Surgeon: Conard Novak, MD;  Location: [facility];  Service: Gynecology;  Laterality: N/A;  . TONSILLECTOMY  1972  . TUBAL LIGATION      Home Medications:  Allergies as of 10/04/2020      Reactions   Bee Venom    Bacitracin Itching, Rash   Bactrim [sulfamethoxazole-trimethoprim] Rash      Medication List       Accurate as of October 04, 2020  3:28 PM. If you have any questions, ask your nurse or doctor.        STOP taking these medications   cefdinir 300 MG capsule Commonly known as: OMNICEF Stopped by: Carman Ching, PA-C     TAKE these medications   ALPRAZolam 0.25 MG tablet Commonly known as: XANAX   cholecalciferol 1000 units tablet Commonly known as: VITAMIN D Take 1,000 Units by mouth daily.   ciprofloxacin 250 MG tablet Commonly known as: CIPRO Take 1 tablet (250 mg total) by  mouth 2 (two) times daily for 5 days. Started by: Carman Ching, PA-C   estradiol 0.1 MG/GM vaginal cream Commonly known as: ESTRACE Dispense a pea size amount on Monday Wednesday and Friday   ferrous sulfate 325 (65 FE) MG tablet Take 325 mg by mouth every other day.   hyoscyamine 0.125 MG tablet Commonly known as: LEVSIN Take by mouth.   ibuprofen 600 MG tablet Commonly known as: ADVIL Take by mouth.   pantoprazole 40 MG tablet Commonly known as: PROTONIX 40 mg daily.   RA Cranberry 500 MG Caps Generic drug: Cranberry Take by mouth.   RA  Vitamin B-12 TR 1000 MCG Tbcr Generic drug: Cyanocobalamin Take by mouth.   valACYclovir 1000 MG tablet Commonly known as: VALTREX Take 1,000 mg by mouth every other day.       Allergies:  Allergies  Allergen Reactions  . Bee Venom   . Bacitracin Itching and Rash  . Bactrim [Sulfamethoxazole-Trimethoprim] Rash    Family History: Family History  Problem Relation Age of Onset  . Hyperlipidemia Mother   . Hypertension Mother   . Hypertension Father   . Hyperlipidemia Father   . Kidney disease Neg Hx   . Bladder Cancer Neg Hx   . Breast cancer Neg Hx     Social History:   reports that she quit smoking about 8 years ago. She has never used smokeless tobacco. She reports current alcohol use. She reports that she does not use drugs.  Physical Exam: BP 114/75   Pulse 73   Temp 97.6 F (36.4 C) (Oral)   Ht 5\' 2"  (1.575 m)   Wt 162 lb (73.5 kg)   LMP 11/09/2015   BMI 29.63 kg/m   Constitutional:  Alert and oriented, no acute distress, nontoxic appearing HEENT: [place], AT, hard of hearing with a cochlear implant Cardiovascular: No clubbing, cyanosis, or edema Respiratory: Normal respiratory effort, no increased work of breathing Skin: No rashes, bruises or suspicious lesions Neurologic: Grossly intact, no focal deficits, moving all 4 extremities Psychiatric: Normal mood and affect  Laboratory Data: Results for orders placed or performed in visit on 10/04/20  Microscopic Examination   Urine  Result Value Ref Range   WBC, UA >30 (A) 0 - 5 /hpf   RBC 3-10 (A) 0 - 2 /hpf   Epithelial Cells (non renal) 0-10 0 - 10 /hpf   Renal Epithel, UA 0-10 (A) None seen /hpf   Bacteria, UA WILL FOLLOW   Urinalysis, Complete  Result Value Ref Range   Specific Gravity, UA >1.030 (H) 1.005 - 1.030   pH, UA 5.0 5.0 - 7.5   Color, UA Yellow Yellow   Appearance Ur Cloudy (A) Clear   Leukocytes,UA 1+ (A) Negative   Protein,UA Negative Negative/Trace   Glucose, UA Negative Negative    Ketones, UA Negative Negative   RBC, UA 2+ (A) Negative   Bilirubin, UA Negative Negative   Urobilinogen, Ur 0.2 0.2 - 1.0 mg/dL   Nitrite, UA Negative Negative   Microscopic Examination See below:   Bladder Scan (Post Void Residual) in office  Result Value Ref Range   Scan Result 0    Assessment & Plan:   1. Recurrent UTI 54 year old female with a history of recurrent UTI now with back to back episodes of dysuria, urgency, and frequency over the past month, having previously completed culture appropriate Keflex and cefdinir.  UA today notable for pyuria and microscopic hematuria consistent with possible infection.  Will start empiric Cipro  and send for culture for further evaluation.  Counseled patient to increase topical vaginal estrogen cream to 3 times weekly.  Counseled her to start a daily probiotic containing lactobacillus and try different brand of cranberry supplements for UTI health.  At this point, I recommend cystoscopy for further evaluation of her back to back UTIs.  She is in agreement with this plan.  We will schedule today. - Urinalysis, Complete - Bladder Scan (Post Void Residual) in office - CULTURE, URINE COMPREHENSIVE - ciprofloxacin (CIPRO) 250 MG tablet; Take 1 tablet (250 mg total) by mouth 2 (two) times daily for 5 days.  Dispense: 10 tablet; Refill: 0   Return in about 2 weeks (around 10/18/2020) for Cystoscopy.  Carman Ching, PA-C  [facility] [address], [address] [place], [place] [address] [phone]

## 2020-10-05 LAB — URINALYSIS, COMPLETE
Bilirubin, UA: NEGATIVE
Glucose, UA: NEGATIVE
Ketones, UA: NEGATIVE
Nitrite, UA: NEGATIVE
Protein,UA: NEGATIVE
Specific Gravity, UA: >1.03 — ABNORMAL HIGH (ref 1.005–1.030)
Urobilinogen, Ur: 0.2 mg/dL (ref 0.2–1.0)
pH, UA: 5 (ref 5.0–7.5)

## 2020-10-05 LAB — MICROSCOPIC EXAMINATION: WBC, UA: >30 /hpf — AB (ref 0–5)

## 2020-10-08 LAB — CULTURE, URINE COMPREHENSIVE

## 2020-10-09 ENCOUNTER — Telehealth: Payer: Self-pay | Admitting: Physician Assistant

## 2020-10-09 NOTE — Telephone Encounter (Signed)
Notified patients husband as advised, he verbalized understanding and confirmed the appt for cysto.

## 2020-10-09 NOTE — Telephone Encounter (Signed)
Please contact the patient's husband and inform him that her recent urine culture has come back negative.  She does not have a urinary tract infection and may stop prescribed antibiotics.  Given that her culture was negative, I think it is all the more important for her to follow-up with scheduled cystoscopy in a couple of weeks to evaluate her for alternative sources of her symptoms.  Please encourage them to keep this appointment.

## 2020-10-23 ENCOUNTER — Other Ambulatory Visit: Payer: Self-pay

## 2020-10-23 ENCOUNTER — Ambulatory Visit: Payer: 59 | Admitting: Urology

## 2020-10-23 ENCOUNTER — Encounter: Payer: Self-pay | Admitting: Urology

## 2020-10-23 VITALS — BP 113/78 | HR 83 | Ht 62.0 in | Wt 167.0 lb

## 2020-10-23 DIAGNOSIS — N390 Urinary tract infection, site not specified: Secondary | ICD-10-CM

## 2020-10-23 DIAGNOSIS — N3946 Mixed incontinence: Secondary | ICD-10-CM

## 2020-10-23 MED ORDER — NITROFURANTOIN MACROCRYSTAL 50 MG PO CAPS: 50.0000 mg | ORAL_CAPSULE | Freq: Every day | ORAL | 5 refills | Status: DC

## 2020-10-23 NOTE — Progress Notes (Signed)
Cystoscopy Procedure Note:  Indication: Dysuria, rUTIs  After informed consent and discussion of the procedure and its risks, Christina Bates was positioned and prepped in the standard fashion. Cystoscopy was performed with a flexible cystoscope. The urethra, bladder neck and entire bladder was visualized in a standard fashion. The ureteral orifices were visualized in their normal location and orientation.  Mucosa grossly normal throughout, no abnormalities on retroflexion.  Findings: Normal cystoscopy  Assessment and Plan: Recommended a trial of 6 months of low-dose nitrofurantoin prophylaxis with her recurrent UTIs despite cranberry tablets and topical vaginal estrogen  Legrand Rams, MD 10/23/2020

## 2020-10-25 LAB — MICROSCOPIC EXAMINATION

## 2020-10-25 LAB — URINALYSIS, COMPLETE
Bilirubin, UA: NEGATIVE
Glucose, UA: NEGATIVE
Ketones, UA: NEGATIVE
Leukocytes,UA: NEGATIVE
Nitrite, UA: NEGATIVE
Protein,UA: NEGATIVE
RBC, UA: NEGATIVE
Specific Gravity, UA: >1.03 — ABNORMAL HIGH (ref 1.005–1.030)
Urobilinogen, Ur: 0.2 mg/dL (ref 0.2–1.0)
pH, UA: 5 (ref 5.0–7.5)

## 2020-11-25 DIAGNOSIS — Z9621 Cochlear implant status: Secondary | ICD-10-CM | POA: Insufficient documentation

## 2021-01-20 ENCOUNTER — Other Ambulatory Visit: Payer: Self-pay | Admitting: Urology

## 2021-03-31 ENCOUNTER — Other Ambulatory Visit: Payer: Self-pay | Admitting: Physician Assistant

## 2021-03-31 DIAGNOSIS — Z1231 Encounter for screening mammogram for malignant neoplasm of breast: Secondary | ICD-10-CM

## 2021-04-01 ENCOUNTER — Encounter: Payer: Self-pay | Admitting: Urology

## 2021-04-23 ENCOUNTER — Other Ambulatory Visit: Payer: Self-pay

## 2021-04-23 ENCOUNTER — Encounter: Payer: Self-pay | Admitting: Urology

## 2021-04-23 ENCOUNTER — Ambulatory Visit (INDEPENDENT_AMBULATORY_CARE_PROVIDER_SITE_OTHER): Payer: 59 | Admitting: Urology

## 2021-04-23 VITALS — BP 105/71 | HR 76 | Ht 63.0 in | Wt 164.0 lb

## 2021-04-23 DIAGNOSIS — N390 Urinary tract infection, site not specified: Secondary | ICD-10-CM

## 2021-04-23 NOTE — Progress Notes (Signed)
   04/23/2021 10:20 AM   Christina Bates [birth year] [id number]  Reason for visit: Follow up recurrent UTIs  HPI: 55 year old female with recurrent UTIs who has been on topical estrogen cream long-term as well as cranberry tablets and continued to have UTIs.  Renal ultrasound in 2021 was benign, and cystoscopy in December 2021 was normal.  At that point we tried 6 months of a low-dose 50 mg nitrofurantoin daily prophylaxis.  She has done well over the last 6 months with no UTIs or urinary symptoms.  She has baseline urgency and frequency, but this is not bothersome enough to consider medication.  We discussed behavioral strategies of avoiding bladder irritants like coffee, caffeine, and sodas.  She has 30 days left of the nitrofurantoin.  I recommended finishing the 30 days, then stopping the prophylactic antibiotics to see how she does.  If she develops recurrent infections at that point, could consider Hiprex twice daily or the 50 mg nitrofurantoin 3 times weekly.  Risks and return precautions discussed.  RTC 1 year symptom check for recurrent UTIs   Sondra Come, MD  [facility] [address], [address] [place], [place] [address] [phone]

## 2021-05-15 ENCOUNTER — Other Ambulatory Visit: Payer: Self-pay

## 2021-05-15 ENCOUNTER — Ambulatory Visit
Admission: EM | Admit: 2021-05-15 | Discharge: 2021-05-15 | Disposition: A | Discharge status: Home / Self Care | Payer: 59 | Attending: Family Medicine | Admitting: Family Medicine

## 2021-05-15 DIAGNOSIS — Z20822 Contact with and (suspected) exposure to covid-19: Secondary | ICD-10-CM | POA: Diagnosis not present

## 2021-05-15 NOTE — Discharge Instructions (Signed)

## 2021-05-15 NOTE — ED Triage Notes (Signed)
Patient states that she was exposed to covid and needs to be tested.

## 2021-05-16 LAB — SARS CORONAVIRUS 2 (TAT 6-24 HRS): SARS Coronavirus 2: NEGATIVE

## 2021-05-19 ENCOUNTER — Other Ambulatory Visit: Payer: Self-pay

## 2021-05-19 ENCOUNTER — Ambulatory Visit
Admission: EM | Admit: 2021-05-19 | Discharge: 2021-05-19 | Disposition: A | Discharge status: Home / Self Care | Payer: 59 | Attending: Family Medicine | Admitting: Family Medicine

## 2021-05-19 DIAGNOSIS — U071 COVID-19: Secondary | ICD-10-CM | POA: Diagnosis not present

## 2021-05-19 MED ORDER — MOLNUPIRAVIR EUA 200MG CAPSULE: 4.0000 | ORAL_CAPSULE | Freq: Two times a day (BID) | ORAL | 0 refills | Status: AC

## 2021-05-19 NOTE — Discharge Instructions (Addendum)
Rest. Lots of fluids.  Medication as directed.  If you worsen, go to the ER.  Take care  Dr. Tamryn Popko  

## 2021-05-19 NOTE — ED Triage Notes (Signed)
Patient states that she was exposed to covid via her husband. States that her home test was positive but that she would like a send out test. States that she has fatigue and sore throat with nasal congestion.

## 2021-05-20 LAB — SARS CORONAVIRUS 2 (TAT 6-24 HRS): SARS Coronavirus 2: POSITIVE — AB

## 2021-05-20 NOTE — ED Provider Notes (Signed)
[facility]    CSN: [id number] Arrival date & time: 05/19/21  1120  History   Chief Complaint Chief Complaint  Patient presents with   Covid Exposure    HPI 55 year old female presents with COVID.  Husband recently diagnosed with COVID. She took a home test and it was positive. She is having fatigue, sore throat, congestion. No fever. No SOB. Symptoms are mild. No other complaints.   Past Medical History:  Diagnosis Date   Anxiety    Complication of anesthesia    Hearing loss of both ears    Heartburn    HLD (hyperlipidemia)    HSV infection    PONV (postoperative nausea and vomiting)    RLS (restless legs syndrome)    Occurs on occasion, better now.    Patient Active Problem List   Diagnosis Date Noted   Dizziness 02/26/2018   Degenerative joint disease of cervical and lumbar spine 02/23/2018   Lumbar radiculopathy 09/17/2017   Pure hypercholesterolemia 03/24/2016   Acute neck pain 12/31/2015   Acute pain of left hip 12/31/2015   Anxiety 12/31/2015   Menorrhagia with irregular cycle 11/19/2015   Dysmenorrhea 11/19/2015   Status post laparoscopic hysterectomy 11/19/2015   Recurrent genital herpes 08/15/2015   Vitamin B 12 deficiency 08/15/2015   Low vitamin B12 level 03/20/2015   Vitamin D deficiency 10/06/2014   Absolute anemia 10/05/2014   Gastro-esophageal reflux disease without esophagitis 10/05/2014    Past Surgical History:  Procedure Laterality Date   ABDOMINAL HYSTERECTOMY     CESAREAN SECTION  1992 & 1993   COCHLEAR IMPLANT Left 2004   CYSTOSCOPY N/A 11/19/2015   Procedure: CYSTOSCOPY;  Surgeon: Conard Novak, MD;  Location: [facility];  Service: Gynecology;  Laterality: N/A;   DILATION AND CURETTAGE OF UTERUS     ENDOMETRIAL ABLATION     FOOT SURGERY Left 2011   FOOT SURGERY Right 2008   HYSTEROSCOPY     LAPAROSCOPIC HYSTERECTOMY N/A 11/19/2015   Procedure: HYSTERECTOMY TOTAL LAPAROSCOPIC/BILATERAL SALPINGECTOMY;  Surgeon:  Conard Novak, MD;  Location: [facility];  Service: Gynecology;  Laterality: N/A;   TONSILLECTOMY  1972   TUBAL LIGATION      OB History   No obstetric history on file.      Home Medications    Prior to Admission medications   Medication Sig Start Date End Date Taking? Authorizing Provider  ALPRAZolam Prudy Feeler) 0.25 MG tablet  12/31/15  Yes [provider]  cholecalciferol (VITAMIN D) 1000 UNITS tablet Take 1,000 Units by mouth daily.   Yes [provider]  Cranberry 500 MG CAPS Take by mouth.   Yes [provider]  Cyanocobalamin 1000 MCG TBCR Take by mouth.   Yes [provider]  estradiol (ESTRACE) 0.1 MG/GM vaginal cream DISPENSE A SMALL PEA SIZE IN THE VAGINA AMOUNT ON MONDAY, [facility] AND FRIDAY 01/20/21  Yes McGowan, Carollee Herter A, PA-C  ferrous sulfate 325 (65 FE) MG tablet Take 325 mg by mouth every other day.    Yes [provider]  hyoscyamine (LEVSIN) 0.125 MG tablet Take by mouth. 12/20/19  Yes [provider]  ibuprofen (ADVIL,MOTRIN) 600 MG tablet Take by mouth. 11/20/15  Yes [provider]  molnupiravir EUA 200 mg CAPS Take 4 capsules (800 mg total) by mouth 2 (two) times daily for 5 days. 05/19/21 05/24/21 Yes Shanora Christensen G, DO  nitrofurantoin (MACRODANTIN) 50 MG capsule Take 1 capsule (50 mg total) by mouth daily. 10/23/20  Yes  Sondra Come, MD  pantoprazole (PROTONIX) 40 MG tablet 40 mg daily.  02/05/15  Yes [provider]  valACYclovir (VALTREX) 1000 MG tablet Take 1,000 mg by mouth every other day.  03/09/15  Yes [provider]    Family History Family History  Problem Relation Age of Onset   Hyperlipidemia Mother    Hypertension Mother    Hypertension Father    Hyperlipidemia Father    Kidney disease Neg Hx    Bladder Cancer Neg Hx    Breast cancer Neg Hx     Social History Social History   Tobacco Use   Smoking status: Former    Pack years: 0.00   Smokeless tobacco: Never   Vaping Use   Vaping Use: Former  Substance Use Topics   Alcohol use: Yes    Alcohol/week: 0.0 standard drinks    Comment: 3-5 drinks per year   Drug use: No     Allergies   Bee venom, Bacitracin, Bactrim [sulfamethoxazole-trimethoprim], and Methenamine-sodium salicylate   Review of Systems Review of Systems Per HPI  Physical Exam Triage Vital Signs ED Triage Vitals  Enc Vitals Group     BP 05/19/21 1238 (!) 131/96     Pulse Rate 05/19/21 1238 85     Resp 05/19/21 1238 16     Temp 05/19/21 1238 98.3 F (36.8 C)     Temp src --      SpO2 05/19/21 1238 99 %     Weight 05/19/21 1236 164 lb 0.4 oz (74.4 kg)     Height 05/19/21 1236 5\' 3"  (1.6 m)     Head Circumference --      Peak Flow --      Pain Score 05/19/21 1236 0     Pain Loc --      Pain Edu? --      Excl. in [place]? --    No data found.  Updated Vital Signs BP (!) 131/96 (BP Location: Left Arm)   Pulse 85   Temp 98.3 F (36.8 C)   Resp 16   Ht 5\' 3"  (1.6 m)   Wt 74.4 kg   LMP 11/09/2015   SpO2 99%   BMI 29.06 kg/m   Visual Acuity Right Eye Distance:   Left Eye Distance:   Bilateral Distance:    Right Eye Near:   Left Eye Near:    Bilateral Near:     Physical Exam Constitutional:      General: She is not in acute distress.    Appearance: Normal appearance. She is not ill-appearing.  HENT:     Head: Normocephalic and atraumatic.  Cardiovascular:     Rate and Rhythm: Normal rate and regular rhythm.  Pulmonary:     Effort: Pulmonary effort is normal.     Breath sounds: Normal breath sounds. No wheezing or rales.  Neurological:     Mental Status: She is alert.     UC Treatments / Results  Labs (all labs ordered are listed, but only abnormal results are displayed) Labs Reviewed  SARS CORONAVIRUS 2 (TAT 6-24 HRS) - Abnormal; Notable for the following components:      Result Value   SARS Coronavirus 2 POSITIVE (*)    All other components within normal limits    EKG   Radiology No  results found.  Procedures Procedures (including critical care time)  Medications Ordered in UC Medications - No data to display  Initial Impression / Assessment and Plan / UC  Course  I have reviewed the triage vital signs and the nursing notes.  Pertinent labs & imaging results that were available during my care of the patient were reviewed by me and considered in my medical decision making (see chart for details).    55 year old female presents with COVID. Treating with Molnupiravir.   Final Clinical Impressions(s) / UC Diagnoses   Final diagnoses:  COVID     Discharge Instructions      Rest.  Lots of fluids.  Medication as directed.  If you worsen, go to the ER.  Take care  Dr. Adriana Simas    ED Prescriptions     Medication Sig Dispense Auth. Provider   molnupiravir EUA 200 mg CAPS Take 4 capsules (800 mg total) by mouth 2 (two) times daily for 5 days. 40 capsule Tommie Sams, DO      PDMP not reviewed this encounter.   Tommie Sams, [place] 05/20/21 2224

## 2021-09-12 ENCOUNTER — Other Ambulatory Visit: Payer: Self-pay

## 2021-09-12 ENCOUNTER — Ambulatory Visit
Admission: RE | Admit: 2021-09-12 | Discharge: 2021-09-12 | Disposition: A | Discharge status: Home / Self Care | Payer: 59 | Source: Ambulatory Visit | Attending: Physician Assistant | Admitting: Physician Assistant

## 2021-09-12 DIAGNOSIS — Z1231 Encounter for screening mammogram for malignant neoplasm of breast: Secondary | ICD-10-CM

## 2021-11-05 ENCOUNTER — Ambulatory Visit: Payer: 59 | Admitting: Urology

## 2021-11-05 ENCOUNTER — Encounter: Payer: Self-pay | Admitting: Urology

## 2021-11-05 ENCOUNTER — Other Ambulatory Visit: Payer: Self-pay

## 2021-11-05 ENCOUNTER — Ambulatory Visit: Payer: 59 | Admitting: Physician Assistant

## 2021-11-05 VITALS — BP 130/80 | HR 74 | Ht 65.0 in | Wt 165.0 lb

## 2021-11-05 DIAGNOSIS — R300 Dysuria: Secondary | ICD-10-CM

## 2021-11-05 DIAGNOSIS — N390 Urinary tract infection, site not specified: Secondary | ICD-10-CM | POA: Diagnosis not present

## 2021-11-05 LAB — MICROSCOPIC EXAMINATION
Bacteria, UA: NONE SEEN
WBC, UA: >30 /hpf — ABNORMAL HIGH (ref 0–5)

## 2021-11-05 LAB — URINALYSIS, COMPLETE
Bilirubin, UA: NEGATIVE
Glucose, UA: NEGATIVE
Ketones, UA: NEGATIVE
Nitrite, UA: NEGATIVE
Protein,UA: NEGATIVE
Specific Gravity, UA: 1.01 (ref 1.005–1.030)
Urobilinogen, Ur: 0.2 mg/dL (ref 0.2–1.0)
pH, UA: 6 (ref 5.0–7.5)

## 2021-11-05 MED ORDER — CEPHALEXIN 500 MG PO CAPS: 500.0000 mg | ORAL_CAPSULE | Freq: Two times a day (BID) | ORAL | 0 refills | Status: DC

## 2021-11-05 NOTE — Progress Notes (Signed)
11/05/2021 10:03 AM   [name] October 25, 1966 [id number]  Referring provider: Marinda Elk, MD [place] [facility] [place],  [place] [address]  Chief Complaint  Patient presents with   Dysuria    HPI: 55 y.o. female followed by Dr. Diamantina Providence for recurrent UTI presents for an acute visit with complaints of dysuria.  Last saw Dr. Diamantina Providence June 2022 and had been doing well on low-dose nitrofurantoin prophylaxis which she completed in July No recurrent infections since that time however 2 days ago had onset of frequency, urgency and dysuria.  Her dysuria is worse today No fever, chills or flank/abdominal/pelvic pain Denies gross hematuria  PMH: Past Medical History:  Diagnosis Date   Anxiety    Complication of anesthesia    Hearing loss of both ears    Heartburn    HLD (hyperlipidemia)    HSV infection    PONV (postoperative nausea and vomiting)    RLS (restless legs syndrome)    Occurs on occasion, better now.    Surgical History: Past Surgical History:  Procedure Laterality Date   ABDOMINAL HYSTERECTOMY     [place] IMPLANT Left 2004   CYSTOSCOPY N/A 11/19/2015   Procedure: CYSTOSCOPY;  Surgeon: Will Bonnet, MD;  Location: [facility];  Service: Gynecology;  Laterality: N/A;   DILATION AND CURETTAGE OF UTERUS     ENDOMETRIAL ABLATION     FOOT SURGERY Left 2011   FOOT SURGERY Right 2008   HYSTEROSCOPY     LAPAROSCOPIC HYSTERECTOMY N/A 11/19/2015   Procedure: HYSTERECTOMY TOTAL LAPAROSCOPIC/BILATERAL SALPINGECTOMY;  Surgeon: Will Bonnet, MD;  Location: [facility];  Service: Gynecology;  Laterality: N/A;   TONSILLECTOMY  1972   TUBAL LIGATION      Home Medications:  Allergies as of 11/05/2021       Reactions   Bee Venom    Bacitracin Itching, Rash   Bactrim [sulfamethoxazole-trimethoprim] Rash   Methenamine-sodium Salicylate Rash        Medication List        Accurate as of  November 05, 2021 10:03 AM. If you have any questions, ask your nurse or doctor.          STOP taking these medications    nitrofurantoin 50 MG capsule Commonly known as: MACRODANTIN Stopped by: Abbie Sons, MD       TAKE these medications    ALPRAZolam 0.25 MG tablet Commonly known as: XANAX   cholecalciferol 1000 units tablet Commonly known as: VITAMIN D Take 1,000 Units by mouth daily.   Cranberry 500 MG Caps Take by mouth.   Cyanocobalamin 1000 MCG Tbcr Take by mouth.   estradiol 0.1 MG/GM vaginal cream Commonly known as: ESTRACE DISPENSE A SMALL PEA SIZE IN THE VAGINA AMOUNT ON MONDAY, WEDNESDAY AND FRIDAY   ferrous sulfate 325 (65 FE) MG tablet Take 325 mg by mouth every other day.   hyoscyamine 0.125 MG tablet Commonly known as: LEVSIN Take by mouth.   ibuprofen 600 MG tablet Commonly known as: ADVIL Take by mouth.   pantoprazole 40 MG tablet Commonly known as: PROTONIX 40 mg daily.   valACYclovir 1000 MG tablet Commonly known as: VALTREX Take 1,000 mg by mouth every other day.        Allergies:  Allergies  Allergen Reactions   Bee Venom    Bacitracin Itching and Rash   Bactrim [Sulfamethoxazole-Trimethoprim] Rash   Methenamine-Sodium Salicylate Rash    Family  History: Family History  Problem Relation Age of Onset   Hyperlipidemia Mother    Hypertension Mother    Hypertension Father    Hyperlipidemia Father    Kidney disease Neg Hx    Bladder Cancer Neg Hx    Breast cancer Neg Hx     Social History:  reports that she quit smoking about 9 years ago. Her smoking use included cigarettes. She has never used smokeless tobacco. She reports current alcohol use. She reports that she does not use drugs.   Physical Exam: BP 130/80    Pulse 74    Ht 5\' 5"  (1.651 m)    Wt 165 lb (74.8 kg)    LMP 11/09/2015    BMI 27.46 kg/m   Constitutional:  Alert and oriented, No acute distress. HEENT: [place] AT, moist mucus membranes.  Trachea  midline, no masses. Cardiovascular: No clubbing, cyanosis, or edema. Respiratory: Normal respiratory effort, no increased work of breathing. Psychiatric: Normal mood and affect.  Laboratory Data:  Urinalysis Dipstick 2+ blood/3+ leukocytes/nitrite negative Microscopy >30 WBC   Assessment & Plan:    1.  Recurrent UTI Has done well off low-dose prophylaxis with this being her first infection since discontinuing She requested Rx cephalexin which she tolerates well and states bid dosing resolves her infections Urine culture was ordered She has follow-up scheduled with Dr. 11/11/2015 in July 2023   August 2023, MD  [facility] [address] [place], [place] [phone]

## 2021-11-12 ENCOUNTER — Telehealth: Payer: Self-pay | Admitting: *Deleted

## 2021-11-12 LAB — CULTURE, URINE COMPREHENSIVE

## 2021-11-12 NOTE — Telephone Encounter (Signed)
-----   Message from Riki Altes, MD sent at 11/12/2021  1:01 PM EST ----- Urine culture was positive for infection and the bacteria was sensitive to cephalexin

## 2021-11-12 NOTE — Telephone Encounter (Signed)
Notified patient as instructed, patient pleased °

## 2021-11-13 ENCOUNTER — Ambulatory Visit
Admission: RE | Admit: 2021-11-13 | Discharge: 2021-11-13 | Disposition: A | Discharge status: Home / Self Care | Payer: 59 | Source: Ambulatory Visit | Attending: Physician Assistant | Admitting: Physician Assistant

## 2021-11-13 ENCOUNTER — Other Ambulatory Visit: Payer: Self-pay

## 2021-11-13 VITALS — BP 112/73 | HR 81 | Temp 98.2°F | Resp 18

## 2021-11-13 DIAGNOSIS — Z1152 Encounter for screening for COVID-19: Secondary | ICD-10-CM

## 2021-11-13 DIAGNOSIS — J069 Acute upper respiratory infection, unspecified: Secondary | ICD-10-CM | POA: Diagnosis not present

## 2021-11-13 DIAGNOSIS — R051 Acute cough: Secondary | ICD-10-CM

## 2021-11-13 DIAGNOSIS — Z20822 Contact with and (suspected) exposure to covid-19: Secondary | ICD-10-CM | POA: Diagnosis not present

## 2021-11-13 MED ORDER — BENZONATATE 100 MG PO CAPS: 100.0000 mg | ORAL_CAPSULE | Freq: Three times a day (TID) | ORAL | 0 refills | Status: DC

## 2021-11-13 NOTE — ED Provider Notes (Signed)
Christina Bates    CSN: [id number] Arrival date & time: 11/13/21  1732      History   Chief Complaint Chief Complaint  Patient presents with   Sore Throat   Cough   Headache    HPI Christina Bates is a 55 y.o. female.   Patient presents today accompanied by her husband to help provide the majority of history.  Reports a 24 to 36-hour history of URI symptoms including sore throat, cough, body aches, headache.  Denies any chest pain, shortness of breath, nausea, vomiting, fever.  She has been taking over-the-counter medications without improvement of symptoms.  Reports he recently tested positive for COVID-19.  She has had both of her vaccines and 1 booster.  Has had COVID approximately 6 months ago.  Denies any additional sick contacts.  She was recently treated with Keflex for UTI but denies additional antibiotic use.  She is a smoker but denies history of chronic lung disease including asthma or COPD.  She denies history of diabetes, immunosuppression, chronic liver/kidney disease, cardiovascular disease.   Past Medical History:  Diagnosis Date   Anxiety    Complication of anesthesia    Hearing loss of both ears    Heartburn    HLD (hyperlipidemia)    HSV infection    PONV (postoperative nausea and vomiting)    RLS (restless legs syndrome)    Occurs on occasion, better now.    Patient Active Problem List   Diagnosis Date Noted   Dizziness 02/26/2018   Degenerative joint disease of cervical and lumbar spine 02/23/2018   Lumbar radiculopathy 09/17/2017   Pure hypercholesterolemia 03/24/2016   Acute neck pain 12/31/2015   Acute pain of left hip 12/31/2015   Anxiety 12/31/2015   Menorrhagia with irregular cycle 11/19/2015   Dysmenorrhea 11/19/2015   Status post laparoscopic hysterectomy 11/19/2015   Recurrent genital herpes 08/15/2015   Vitamin B 12 deficiency 08/15/2015   Low vitamin B12 level 03/20/2015   Vitamin D deficiency 10/06/2014   Absolute anemia  10/05/2014   Gastro-esophageal reflux disease without esophagitis 10/05/2014    Past Surgical History:  Procedure Laterality Date   ABDOMINAL HYSTERECTOMY     [place] IMPLANT Left 2004   CYSTOSCOPY N/A 11/19/2015   Procedure: CYSTOSCOPY;  Surgeon: Will Bonnet, MD;  Location: [facility];  Service: Gynecology;  Laterality: N/A;   DILATION AND CURETTAGE OF UTERUS     ENDOMETRIAL ABLATION     FOOT SURGERY Left 2011   FOOT SURGERY Right 2008   HYSTEROSCOPY     LAPAROSCOPIC HYSTERECTOMY N/A 11/19/2015   Procedure: HYSTERECTOMY TOTAL LAPAROSCOPIC/BILATERAL SALPINGECTOMY;  Surgeon: Will Bonnet, MD;  Location: [facility];  Service: Gynecology;  Laterality: N/A;   TONSILLECTOMY  1972   TUBAL LIGATION      OB History   No obstetric history on file.      Home Medications    Prior to Admission medications   Medication Sig Start Date End Date Taking? Authorizing Provider  benzonatate (TESSALON) 100 MG capsule Take 1 capsule (100 mg total) by mouth every 8 (eight) hours. 11/13/21  Yes Brandyn Thien, Derry Skill, PA-C  ALPRAZolam Duanne Moron) 0.25 MG tablet  12/31/15   [provider]  cephALEXin (KEFLEX) 500 MG capsule Take 1 capsule (500 mg total) by mouth 2 (two) times daily. 11/05/21   Stoioff, Ronda Fairly, MD  cholecalciferol (VITAMIN D) 1000 UNITS tablet Take 1,000 Units by mouth daily.  [provider]  Cranberry 500 MG CAPS Take by mouth.    [provider]  Cyanocobalamin 1000 MCG TBCR Take by mouth.    [provider]  estradiol (ESTRACE) 0.1 MG/GM vaginal cream DISPENSE A SMALL PEA SIZE IN THE VAGINA AMOUNT ON MONDAY, [facility] AND FRIDAY 01/20/21   Zara Council A, PA-C  ferrous sulfate 325 (65 FE) MG tablet Take 325 mg by mouth every other day.     [provider]  hyoscyamine (LEVSIN) 0.125 MG tablet Take by mouth. 12/20/19   [provider]  ibuprofen (ADVIL,MOTRIN) 600 MG tablet Take by mouth. 11/20/15    [provider]  pantoprazole (PROTONIX) 40 MG tablet 40 mg daily.  02/05/15   [provider]  valACYclovir (VALTREX) 1000 MG tablet Take 1,000 mg by mouth every other day.  03/09/15   [provider]    Family History Family History  Problem Relation Age of Onset   Hyperlipidemia Mother    Hypertension Mother    Hypertension Father    Hyperlipidemia Father    Kidney disease Neg Hx    Bladder Cancer Neg Hx    Breast cancer Neg Hx     Social History Social History   Tobacco Use   Smoking status: Former    Types: Cigarettes    Quit date: 11/12/2011    Years since quitting: 10.0   Smokeless tobacco: Never  Vaping Use   Vaping Use: Former  Substance Use Topics   Alcohol use: Yes    Alcohol/week: 0.0 standard drinks    Comment: 3-5 drinks per year   Drug use: No     Allergies   Bee venom, Bacitracin, Bactrim [sulfamethoxazole-trimethoprim], and Methenamine-sodium salicylate   Review of Systems Review of Systems  Constitutional:  Positive for activity change. Negative for appetite change, fatigue and fever.  HENT:  Positive for congestion, postnasal drip, sinus pressure and sore throat. Negative for sneezing.   Respiratory:  Positive for cough. Negative for shortness of breath.   Cardiovascular:  Negative for chest pain.  Gastrointestinal:  Negative for abdominal pain, diarrhea, nausea and vomiting.  Musculoskeletal:  Positive for arthralgias and myalgias.  Neurological:  Positive for headaches. Negative for dizziness and light-headedness.    Physical Exam Triage Vital Signs ED Triage Vitals  Enc Vitals Group     BP 11/13/21 1744 112/73     Pulse Rate 11/13/21 1744 81     Resp 11/13/21 1744 18     Temp 11/13/21 1744 98.2 F (36.8 C)     Temp Source 11/13/21 1744 Oral     SpO2 11/13/21 1744 96 %     Weight --      Height --      Head Circumference --      Peak Flow --      Pain Score 11/13/21 1751 0     Pain Loc --      Pain  Edu? --      Excl. in [place]? --    No data found.  Updated Vital Signs BP 112/73 (BP Location: Left Arm)    Pulse 81    Temp 98.2 F (36.8 C) (Oral)    Resp 18    LMP 11/09/2015    SpO2 96%   Visual Acuity Right Eye Distance:   Left Eye Distance:   Bilateral Distance:    Right Eye Near:   Left Eye Near:    Bilateral Near:     Physical Exam Vitals  reviewed.  Constitutional:      General: She is awake. She is not in acute distress.    Appearance: Normal appearance. She is well-developed. She is not ill-appearing.     Comments: Very pleasant female appears stated age in no acute distress sitting comfortably in exam room  HENT:     Head: Normocephalic and atraumatic.     Right Ear: Tympanic membrane, ear canal and external ear normal. Tympanic membrane is not erythematous or bulging.     Left Ear: Tympanic membrane, ear canal and external ear normal. Tympanic membrane is not erythematous or bulging.     Mouth/Throat:     Pharynx: Uvula midline. Posterior oropharyngeal erythema present. No oropharyngeal exudate.  Cardiovascular:     Rate and Rhythm: Normal rate and regular rhythm.     Heart sounds: Normal heart sounds, S1 normal and S2 normal. No murmur heard. Pulmonary:     Effort: Pulmonary effort is normal.     Breath sounds: Normal breath sounds. No wheezing, rhonchi or rales.     Comments: Clear to auscultation bilaterally Psychiatric:        Behavior: Behavior is cooperative.     UC Treatments / Results  Labs (all labs ordered are listed, but only abnormal results are displayed) Labs Reviewed  COVID-19, FLU A+B NAA    EKG   Radiology No results found.  Procedures Procedures (including critical care time)  Medications Ordered in UC Medications - No data to display  Initial Impression / Assessment and Plan / UC Course  I have reviewed the triage vital signs and the nursing notes.  Pertinent labs & imaging results that were available during my care of the  patient were reviewed by me and considered in my medical decision making (see chart for details).     Discussed that symptoms are likely related to COVID-19 given no known sick contacts recently diagnosed with this condition.  COVID and flu viral testing were sent off we will contact patient if this is positive.  We will use Tessalon to help manage cough symptoms in the meantime.  Recommended over-the-counter medications including Tylenol, Mucinex, Flonase.  Discussed potential use of antiviral medications if she does test positive.  Patient is interested in Paxlovid; her last kidney function in care everywhere was GFR of 87 based on metabolic panel from 10/03/2021.  Based on her current medication list she would need to hold or decrease the dose of alprazolam.  Recommended she rest and drink plenty of fluid.  Discussed alarm symptoms that warrant emergent evaluation including chest pain, shortness of breath, high fever not responding to medication, nausea, vomiting.  Strict return precautions given to which family expressed understanding.  Final Clinical Impressions(s) / UC Diagnoses   Final diagnoses:  Encounter for screening for COVID-19  Close exposure to COVID-19 virus  Upper respiratory tract infection, unspecified type  Acute cough     Discharge Instructions      We will contact you if your COVID test is positive.  I also recommend that you monitor your MyChart and if you notice a positive result contact our office we can start you on antivirals within 5 days of symptoms.  If you are on antivirals you would need to hold your alprazolam (Xanax).  Use over-the-counter medications including Tylenol, Mucinex, Flonase.  Use Tessalon for cough.  Make sure you rest and drink plenty of fluid.  If you develop any worsening symptoms including chest discomfort, shortness of breath, fever not responding to medication,  nausea/vomiting interfering with oral intake you need to go to the emergency room.   If symptoms have not improved within a week please return here or see your PCP.     ED Prescriptions     Medication Sig Dispense Auth. Provider   benzonatate (TESSALON) 100 MG capsule Take 1 capsule (100 mg total) by mouth every 8 (eight) hours. 21 capsule Terelle Dobler K, PA-C      I have reviewed the PDMP during this encounter.   Terrilee Croak, PA-C 11/13/21 1819

## 2021-11-13 NOTE — ED Triage Notes (Signed)
Pt has ST, cough, bodyaches, and HA started yesterday. Patients husband has Covid

## 2021-11-13 NOTE — Discharge Instructions (Signed)
We will contact you if your COVID test is positive.  I also recommend that you monitor your MyChart and if you notice a positive result contact our office we can start you on antivirals within 5 days of symptoms.  If you are on antivirals you would need to hold your alprazolam (Xanax).  Use over-the-counter medications including Tylenol, Mucinex, Flonase.  Use Tessalon for cough.  Make sure you rest and drink plenty of fluid.  If you develop any worsening symptoms including chest discomfort, shortness of breath, fever not responding to medication, nausea/vomiting interfering with oral intake you need to go to the emergency room.  If symptoms have not improved within a week please return here or see your PCP.

## 2021-11-14 LAB — COVID-19, FLU A+B NAA
Influenza A, NAA: NOT DETECTED
Influenza B, NAA: NOT DETECTED
SARS-CoV-2, NAA: NOT DETECTED

## 2021-11-19 ENCOUNTER — Ambulatory Visit: Payer: 59 | Admitting: Physician Assistant

## 2021-11-19 ENCOUNTER — Other Ambulatory Visit: Payer: Self-pay

## 2021-11-19 ENCOUNTER — Encounter: Payer: Self-pay | Admitting: Physician Assistant

## 2021-11-19 VITALS — BP 117/79 | HR 72 | Ht 65.0 in | Wt 165.0 lb

## 2021-11-19 DIAGNOSIS — R300 Dysuria: Secondary | ICD-10-CM

## 2021-11-19 LAB — URINALYSIS, COMPLETE
Bilirubin, UA: NEGATIVE
Glucose, UA: NEGATIVE
Ketones, UA: NEGATIVE
Nitrite, UA: NEGATIVE
Protein,UA: NEGATIVE
RBC, UA: NEGATIVE
Specific Gravity, UA: 1.025 (ref 1.005–1.030)
Urobilinogen, Ur: 0.2 mg/dL (ref 0.2–1.0)
pH, UA: 5 (ref 5.0–7.5)

## 2021-11-19 LAB — MICROSCOPIC EXAMINATION: Bacteria, UA: NONE SEEN

## 2021-11-19 NOTE — Progress Notes (Signed)
11/19/2021 1:16 PM   [name] May 21, 1966 [id number]  CC: Chief Complaint  Patient presents with   Dysuria   HPI: Christina Bates is a 55 y.o. female with PMH recurrent UTI previously on Macrobid prophylaxis who presents today for evaluation of possible acute cystitis.   She was seen by Dr. Lonna Cobb 2 weeks ago for the same, at which point UA was notable for pyuria.  She was treated with empiric Keflex twice daily.  Urine culture ultimately grew pansensitive E. coli.  She reports her symptoms completely resolved after taking Keflex.  Today she reports a 3 to 4-day history of dysuria associated with frequency and leakage.  She has not taken any medication at home to help with her symptoms.  She continues to take daily cranberry and probiotics for UTI prevention.  She reports a rash with Azo use.  In-office UA today positive for trace leukocyte esterase; urine microscopy with 6-10 WBCs/HPF.  PMH: Past Medical History:  Diagnosis Date   Anxiety    Complication of anesthesia    Hearing loss of both ears    Heartburn    HLD (hyperlipidemia)    HSV infection    PONV (postoperative nausea and vomiting)    RLS (restless legs syndrome)    Occurs on occasion, better now.    Surgical History: Past Surgical History:  Procedure Laterality Date   ABDOMINAL HYSTERECTOMY     CESAREAN SECTION  1992 & 1993   COCHLEAR IMPLANT Left 2004   CYSTOSCOPY N/A 11/19/2015   Procedure: CYSTOSCOPY;  Surgeon: Conard Novak, MD;  Location: [facility];  Service: Gynecology;  Laterality: N/A;   DILATION AND CURETTAGE OF UTERUS     ENDOMETRIAL ABLATION     FOOT SURGERY Left 2011   FOOT SURGERY Right 2008   HYSTEROSCOPY     LAPAROSCOPIC HYSTERECTOMY N/A 11/19/2015   Procedure: HYSTERECTOMY TOTAL LAPAROSCOPIC/BILATERAL SALPINGECTOMY;  Surgeon: Conard Novak, MD;  Location: [facility];  Service: Gynecology;  Laterality: N/A;   TONSILLECTOMY  1972   TUBAL LIGATION      Home  Medications:  Allergies as of 11/19/2021       Reactions   Bee Venom    Bacitracin Itching, Rash   Bactrim [sulfamethoxazole-trimethoprim] Rash   Methenamine-sodium Salicylate Rash        Medication List        Accurate as of November 19, 2021  1:16 PM. If you have any questions, ask your nurse or doctor.          STOP taking these medications    cephALEXin 500 MG capsule Commonly known as: KEFLEX Stopped by: Carman Ching, PA-C       TAKE these medications    ALPRAZolam 0.25 MG tablet Commonly known as: XANAX   benzonatate 100 MG capsule Commonly known as: TESSALON Take 1 capsule (100 mg total) by mouth every 8 (eight) hours.   cholecalciferol 1000 units tablet Commonly known as: VITAMIN D Take 1,000 Units by mouth daily.   Cranberry 500 MG Caps Take by mouth.   Cyanocobalamin 1000 MCG Tbcr Take by mouth.   estradiol 0.1 MG/GM vaginal cream Commonly known as: ESTRACE DISPENSE A SMALL PEA SIZE IN THE VAGINA AMOUNT ON MONDAY, WEDNESDAY AND FRIDAY   ferrous sulfate 325 (65 FE) MG tablet Take 325 mg by mouth every other day.   hyoscyamine 0.125 MG tablet Commonly known as: LEVSIN Take by mouth.   ibuprofen 600 MG tablet Commonly known as: ADVIL Take by  mouth.   pantoprazole 40 MG tablet Commonly known as: PROTONIX 40 mg daily.   valACYclovir 1000 MG tablet Commonly known as: VALTREX Take 1,000 mg by mouth every other day.        Allergies:  Allergies  Allergen Reactions   Bee Venom    Bacitracin Itching and Rash   Bactrim [Sulfamethoxazole-Trimethoprim] Rash   Methenamine-Sodium Salicylate Rash    Family History: Family History  Problem Relation Age of Onset   Hyperlipidemia Mother    Hypertension Mother    Hypertension Father    Hyperlipidemia Father    Kidney disease Neg Hx    Bladder Cancer Neg Hx    Breast cancer Neg Hx     Social History:   reports that she quit smoking about 10 years ago. Her smoking use  included cigarettes. She has never used smokeless tobacco. She reports current alcohol use. She reports that she does not use drugs.  Physical Exam: BP 117/79    Pulse 72    Ht 5\' 5"  (1.651 m)    Wt 165 lb (74.8 kg)    LMP 11/09/2015    BMI 27.46 kg/m   Constitutional:  Alert and oriented, no acute distress, nontoxic appearing HEENT: [place], AT Cardiovascular: No clubbing, cyanosis, or edema Respiratory: Normal respiratory effort, no increased work of breathing Skin: No rashes, bruises or suspicious lesions Neurologic: Grossly intact, no focal deficits, moving all 4 extremities Psychiatric: Normal mood and affect  Laboratory Data: Results for orders placed or performed in visit on 11/19/21  Microscopic Examination   Urine  Result Value Ref Range   WBC, UA 6-10 (A) 0 - 5 /hpf   RBC 0-2 0 - 2 /hpf   Epithelial Cells (non renal) 0-10 0 - 10 /hpf   Bacteria, UA None seen None seen/Few  Urinalysis, Complete  Result Value Ref Range   Specific Gravity, UA 1.025 1.005 - 1.030   pH, UA 5.0 5.0 - 7.5   Color, UA Yellow Yellow   Appearance Ur Clear Clear   Leukocytes,UA Trace (A) Negative   Protein,UA Negative Negative/Trace   Glucose, UA Negative Negative   Ketones, UA Negative Negative   RBC, UA Negative Negative   Bilirubin, UA Negative Negative   Urobilinogen, Ur 0.2 0.2 - 1.0 mg/dL   Nitrite, UA Negative Negative   Microscopic Examination See below:    Assessment & Plan:   1. Dysuria UA not particularly impressive today with only mild pyuria. Recommend deferring therapy pending culture results; patient agrees. Will call her in 2 days to recheck symptoms/start therapy per results vs symptom worsening. - Urinalysis, Complete - CULTURE, URINE COMPREHENSIVE  Return for Will call with results.  Debroah Loop, PA-C  [facility] [address] [place], [place] [address] [phone]

## 2021-11-19 NOTE — Patient Instructions (Signed)
Your urine looked ok today with only a small amount of inflammatory cells. Let's hold off on antibiotics for right now given that you just had some 2 weeks ago. I'm going to send your urine for culture today and will call you on Friday. At that point hopefully we'll have your culture results available; if not then we can start treatment based on your symptoms.

## 2021-11-21 ENCOUNTER — Telehealth: Payer: Self-pay

## 2021-11-21 MED ORDER — NITROFURANTOIN MONOHYD MACRO 100 MG PO CAPS: 100.0000 mg | ORAL_CAPSULE | Freq: Two times a day (BID) | ORAL | 0 refills | Status: DC

## 2021-11-21 NOTE — Telephone Encounter (Signed)
Patient's husband returned call, patient symptoms have not improved. Sent in Macrobid to [employer]. Voiced understanding.

## 2021-11-21 NOTE — Telephone Encounter (Signed)
-----   Message from Carman Ching, [place] sent at 11/21/2021 12:03 PM EST ----- Can you please call her, wish her a happy birthday, and ask how she's feeling? Unfortunately her urine culture has not resulted yet. If she's still symptomatic, ok to start empiric Macrobid 100mg  BID x5 days in light of the long weekend. ----- Message ----- From: , PA-C Sent: 11/19/2021   1:38 PM EST To: 11/21/2021, PA-C  Call her to recheck her symptoms/start abx per cx results

## 2021-11-21 NOTE — Telephone Encounter (Signed)
Attempted to reach patient unsuccessfully. LMOM asking pt to please return call, see message below.

## 2021-11-23 LAB — CULTURE, URINE COMPREHENSIVE

## 2021-11-26 ENCOUNTER — Telehealth: Payer: Self-pay

## 2021-11-26 NOTE — Telephone Encounter (Signed)
LMOM asking patient to return call. See message below.

## 2021-11-26 NOTE — Telephone Encounter (Signed)
-----   Message from Carman Ching, [place] sent at 11/25/2021  5:59 PM EST ----- Ucx negative, ok to stop antibiotics. Recommend sx recheck and UA appt in 4 weeks. ----- Message ----- From: Nell Range Lab Results In Sent: 11/19/2021   4:36 PM EST To: Carman Ching, PA-C

## 2021-11-26 NOTE — Telephone Encounter (Signed)
Patient notified and voiced understanding.  Appointment scheduled.

## 2021-12-01 ENCOUNTER — Encounter: Payer: Self-pay | Admitting: Physician Assistant

## 2021-12-08 ENCOUNTER — Other Ambulatory Visit: Payer: Self-pay

## 2021-12-08 ENCOUNTER — Ambulatory Visit
Admission: RE | Admit: 2021-12-08 | Discharge: 2021-12-08 | Disposition: A | Discharge status: Home / Self Care | Payer: 59 | Source: Ambulatory Visit | Attending: Physician Assistant | Admitting: Physician Assistant

## 2021-12-08 VITALS — BP 110/74 | HR 76 | Temp 97.8°F | Resp 18 | Ht 65.0 in | Wt 164.9 lb

## 2021-12-08 DIAGNOSIS — M545 Low back pain, unspecified: Secondary | ICD-10-CM

## 2021-12-08 DIAGNOSIS — S39012A Strain of muscle, fascia and tendon of lower back, initial encounter: Secondary | ICD-10-CM | POA: Diagnosis not present

## 2021-12-08 MED ORDER — PREDNISONE 10 MG PO TABS
ORAL_TABLET | ORAL | 0 refills | Status: DC
Start: 2021-12-08 — End: 2022-01-02

## 2021-12-08 MED ORDER — CYCLOBENZAPRINE HCL 10 MG PO TABS: 10.0000 mg | ORAL_TABLET | Freq: Three times a day (TID) | ORAL | 0 refills | Status: DC | PRN

## 2021-12-08 NOTE — ED Provider Notes (Signed)
[facility]    CSN: [id number] Arrival date & time: 12/08/21  0941      History   Chief Complaint Chief Complaint  Patient presents with   Appointment   Back Pain    HPI Christina Bates is a 56 y.o. female presenting for bilateral lower back pain, right hip pain and left knee pain for the past 1 week.  Patient reports back pain started after she did repeated bending over.  States she was bending over a lot to shave her legs and was also doing things around the house that caused her to bend over a lot.  Patient says pain got worse after that.  She has been taking ibuprofen and Tylenol without improvement in condition.  Denies numbness, tingling or weakness.  Does not report saddle anesthesia or loss of bowel or bladder control.  Not reporting any similar problems in the past.  No other complaints.  HPI  Past Medical History:  Diagnosis Date   Anxiety    Complication of anesthesia    Hearing loss of both ears    Heartburn    HLD (hyperlipidemia)    HSV infection    PONV (postoperative nausea and vomiting)    RLS (restless legs syndrome)    Occurs on occasion, better now.    Patient Active Problem List   Diagnosis Date Noted   Dizziness 02/26/2018   Degenerative joint disease of cervical and lumbar spine 02/23/2018   Lumbar radiculopathy 09/17/2017   Pure hypercholesterolemia 03/24/2016   Acute neck pain 12/31/2015   Acute pain of left hip 12/31/2015   Anxiety 12/31/2015   Menorrhagia with irregular cycle 11/19/2015   Dysmenorrhea 11/19/2015   Status post laparoscopic hysterectomy 11/19/2015   Recurrent genital herpes 08/15/2015   Vitamin B 12 deficiency 08/15/2015   Low vitamin B12 level 03/20/2015   Vitamin D deficiency 10/06/2014   Absolute anemia 10/05/2014   Gastro-esophageal reflux disease without esophagitis 10/05/2014    Past Surgical History:  Procedure Laterality Date   ABDOMINAL HYSTERECTOMY     [place]  IMPLANT Left 2004   CYSTOSCOPY N/A 11/19/2015   Procedure: CYSTOSCOPY;  Surgeon: Will Bonnet, MD;  Location: [facility];  Service: Gynecology;  Laterality: N/A;   DILATION AND CURETTAGE OF UTERUS     ENDOMETRIAL ABLATION     FOOT SURGERY Left 2011   FOOT SURGERY Right 2008   HYSTEROSCOPY     LAPAROSCOPIC HYSTERECTOMY N/A 11/19/2015   Procedure: HYSTERECTOMY TOTAL LAPAROSCOPIC/BILATERAL SALPINGECTOMY;  Surgeon: Will Bonnet, MD;  Location: [facility];  Service: Gynecology;  Laterality: N/A;   TONSILLECTOMY  1972   TUBAL LIGATION      OB History   No obstetric history on file.      Home Medications    Prior to Admission medications   Medication Sig Start Date End Date Taking? Authorizing Provider  ALPRAZolam Duanne Moron) 0.25 MG tablet  12/31/15  Yes [provider]  cholecalciferol (VITAMIN D) 1000 UNITS tablet Take 1,000 Units by mouth daily.   Yes [provider]  Cranberry 500 MG CAPS Take by mouth.   Yes [provider]  cyclobenzaprine (FLEXERIL) 10 MG tablet Take 1 tablet (10 mg total) by mouth 3 (three) times daily as needed for muscle spasms. 12/08/21  Yes Laurene Footman B, PA-C  estradiol (ESTRACE) 0.1 MG/GM vaginal cream DISPENSE A SMALL PEA SIZE IN THE VAGINA AMOUNT ON MONDAY, [facility] AND FRIDAY 01/20/21  Yes  Zara Council A, PA-C  ibuprofen (ADVIL,MOTRIN) 600 MG tablet Take by mouth. 11/20/15  Yes [provider]  pantoprazole (PROTONIX) 40 MG tablet 40 mg daily.  02/05/15  Yes [provider]  predniSONE (DELTASONE) 10 MG tablet Take 6 tabs p.o. on day 1 and decrease by 1 tablet daily until complete 12/08/21  Yes Danton Clap, PA-C  valACYclovir (VALTREX) 1000 MG tablet Take 1,000 mg by mouth every other day.  03/09/15  Yes [provider]  Cyanocobalamin 1000 MCG TBCR Take by mouth.    [provider]  ferrous sulfate 325 (65 FE) MG tablet Take 325 mg by mouth every other day.     [provider]   hyoscyamine (LEVSIN) 0.125 MG tablet Take by mouth. 12/20/19   [provider]    Family History Family History  Problem Relation Age of Onset   Hyperlipidemia Mother    Hypertension Mother    Hypertension Father    Hyperlipidemia Father    Kidney disease Neg Hx    Bladder Cancer Neg Hx    Breast cancer Neg Hx     Social History Social History   Tobacco Use   Smoking status: Former    Types: Cigarettes    Quit date: 11/12/2011    Years since quitting: 10.0   Smokeless tobacco: Never  Vaping Use   Vaping Use: Former  Substance Use Topics   Alcohol use: Yes    Alcohol/week: 0.0 standard drinks    Comment: 3-5 drinks per year   Drug use: No     Allergies   Bee venom, Bacitracin, Bactrim [sulfamethoxazole-trimethoprim], and Methenamine-sodium salicylate   Review of Systems Review of Systems  Constitutional:  Negative for fatigue and fever.  Gastrointestinal:  Negative for abdominal pain.  Genitourinary:  Negative for dysuria and flank pain.  Musculoskeletal:  Positive for back pain. Negative for gait problem and joint swelling.  Neurological:  Negative for weakness and numbness.    Physical Exam Triage Vital Signs ED Triage Vitals  Enc Vitals Group     BP      Pulse      Resp      Temp      Temp src      SpO2      Weight      Height      Head Circumference      Peak Flow      Pain Score      Pain Loc      Pain Edu?      Excl. in [place]?    No data found.  Updated Vital Signs BP 110/74 (BP Location: Right Arm)    Pulse 76    Temp 97.8 F (36.6 C) (Oral)    Resp 18    Ht 5\' 5"  (1.651 m)    Wt 164 lb 14.5 oz (74.8 kg)    LMP 11/09/2015    SpO2 97%    BMI 27.44 kg/m       Physical Exam Vitals and nursing note reviewed.  Constitutional:      General: She is not in acute distress.    Appearance: Normal appearance. She is not ill-appearing or toxic-appearing.  HENT:     Head: Normocephalic and atraumatic.  Eyes:     General: No scleral  icterus.       Right eye: No discharge.        Left eye: No discharge.     Conjunctiva/sclera: Conjunctivae normal.  Cardiovascular:     Rate and Rhythm: Normal rate and regular rhythm.     Heart sounds: Normal heart sounds.  Pulmonary:     Effort: Pulmonary effort is normal. No respiratory distress.     Breath sounds: Normal breath sounds.  Musculoskeletal:     Cervical back: Neck supple.     Lumbar back: Tenderness (TTP bilateral lumbar paravertebral muscles) present. Decreased range of motion (decreased flexion and extension, especially extension). Negative right straight leg raise test and negative left straight leg raise test.     Right hip: Normal.     Left knee: Normal.  Skin:    General: Skin is dry.  Neurological:     General: No focal deficit present.     Mental Status: She is alert. Mental status is at baseline.     Motor: No weakness.     Coordination: Coordination normal.     Gait: Gait normal.  Psychiatric:        Mood and Affect: Mood normal.        Behavior: Behavior normal.        Thought Content: Thought content normal.     UC Treatments / Results  Labs (all labs ordered are listed, but only abnormal results are displayed) Labs Reviewed - No data to display  EKG   Radiology No results found.  Procedures Procedures (including critical care time)  Medications Ordered in UC Medications - No data to display  Initial Impression / Assessment and Plan / UC Course  I have reviewed the triage vital signs and the nursing notes.  Pertinent labs & imaging results that were available during my care of the patient were reviewed by me and considered in my medical decision making (see chart for details).  56 year old female presenting for a lumbar pain for the past 1 week.  Symptoms began after repeated bending over.  Reports right hip pain as well as left knee pain.  Patient believes it has to do with where she is walking because she cannot stand up totally  straight.  No red flag signs or symptoms.  Patient's presentation consistent with lumbar strain and muscle spasms.  Pain radiating to right buttocks could represent beginnings of sciatica.  Patient has reassuring exam.  Good strength and sensation.  We will treat at this time with prednisone since NSAIDs are not helping and also Flexeril.  Also reviewed lidocaine patches, heat and muscle rubs.  Reviewed return and ER precautions relating to back pain.  Work note given.   Final Clinical Impressions(s) / UC Diagnoses   Final diagnoses:  Acute bilateral low back pain, unspecified whether sciatica present  Strain of lumbar region, initial encounter     Discharge Instructions      BACK PAIN: Stressed avoiding painful activities . RICE (REST, ICE, COMPRESSION, ELEVATION) guidelines reviewed. May alternate ice and heat. Consider use of muscle rubs, Salonpas patches, etc. Use medications as directed including muscle relaxers if prescribed. Take anti-inflammatory medications as prescribed or OTC NSAIDs/Tylenol.  F/u with PCP in 7-10 days for reexamination, and please feel free to call or return to the urgent care at any time for any questions or concerns you may have and we will be happy to help you!   BACK PAIN RED FLAGS: If the back pain acutely worsens or there are any red flag symptoms such as numbness/tingling, leg weakness, saddle anesthesia, or loss of bowel/bladder control, go immediately to the ER. Follow up with [place] as scheduled or sooner if  the pain does not begin to resolve or if it worsens before the follow up       ED Prescriptions     Medication Sig Dispense Auth. Provider   predniSONE (DELTASONE) 10 MG tablet Take 6 tabs p.o. on day 1 and decrease by 1 tablet daily until complete 21 tablet Laurene Footman B, PA-C   cyclobenzaprine (FLEXERIL) 10 MG tablet Take 1 tablet (10 mg total) by mouth 3 (three) times daily as needed for muscle spasms. 20 tablet Danton Clap, PA-C      I  have reviewed the PDMP during this encounter.   Danton Clap, PA-C 12/08/21 1017

## 2021-12-08 NOTE — ED Triage Notes (Signed)
Pt c/o lower back pain. She states the pain radiates down her right hip and legStarted about a week ago, has gotten worse int he last few days. She states she was bending over a lot when it started and then bent down to shave her legs and pain got much worse. She has been taking ibuprofen.

## 2021-12-08 NOTE — Discharge Instructions (Signed)

## 2021-12-12 ENCOUNTER — Other Ambulatory Visit: Payer: Self-pay

## 2021-12-12 ENCOUNTER — Ambulatory Visit: Payer: 59 | Admitting: Urology

## 2021-12-12 ENCOUNTER — Encounter: Payer: Self-pay | Admitting: Urology

## 2021-12-12 VITALS — BP 106/74 | HR 84 | Ht 65.0 in | Wt 164.0 lb

## 2021-12-12 DIAGNOSIS — R300 Dysuria: Secondary | ICD-10-CM | POA: Diagnosis not present

## 2021-12-12 LAB — URINALYSIS, COMPLETE
Bilirubin, UA: NEGATIVE
Glucose, UA: NEGATIVE
Ketones, UA: NEGATIVE
Nitrite, UA: NEGATIVE
Protein,UA: NEGATIVE
Specific Gravity, UA: 1.015 (ref 1.005–1.030)
Urobilinogen, Ur: 0.2 mg/dL (ref 0.2–1.0)
pH, UA: 6 (ref 5.0–7.5)

## 2021-12-12 LAB — MICROSCOPIC EXAMINATION: WBC, UA: >30 /hpf — AB (ref 0–5)

## 2021-12-12 MED ORDER — NITROFURANTOIN MONOHYD MACRO 100 MG PO CAPS: 100.0000 mg | ORAL_CAPSULE | Freq: Two times a day (BID) | ORAL | 0 refills | Status: DC

## 2021-12-14 NOTE — Progress Notes (Signed)
12/12/2021 9:10 PM   Christina Bates 04-13-1966 [id number]  Referring provider: Marinda Elk, MD [facility] [place],  [place] [phone]  Chief Complaint  Patient presents with   Dysuria   Urological history: 1. rUTI's -contributing factors of age, vaginal atrophy, sexually active and incontinence -RUS 2021 - NED -cysto 2021 - NED  -Documented positive urine cultures over the last year  11/05/2021 E.coli -managed with vaginal estrogen cream   HPI: Christina Bates is a 56 y.o. female who presents today for pain with urination.  She has been having an increase in urinary frequency, dysuria and lower back pain. CATH UA with greater than 30 WBCs and many bacteria.  Patient denies any modifying or aggravating factors.  Patient denies any gross hematuria, dysuria or suprapubic/flank pain.  Patient denies any fevers, chills, nausea or vomiting.    PMH: Past Medical History:  Diagnosis Date   Anxiety    Complication of anesthesia    Hearing loss of both ears    Heartburn    HLD (hyperlipidemia)    HSV infection    PONV (postoperative nausea and vomiting)    RLS (restless legs syndrome)    Occurs on occasion, better now.    Surgical History: Past Surgical History:  Procedure Laterality Date   ABDOMINAL HYSTERECTOMY     [place] IMPLANT Left 2004   CYSTOSCOPY N/A 11/19/2015   Procedure: CYSTOSCOPY;  Surgeon: Will Bonnet, MD;  Location: [facility];  Service: Gynecology;  Laterality: N/A;   DILATION AND CURETTAGE OF UTERUS     ENDOMETRIAL ABLATION     FOOT SURGERY Left 2011   FOOT SURGERY Right 2008   HYSTEROSCOPY     LAPAROSCOPIC HYSTERECTOMY N/A 11/19/2015   Procedure: HYSTERECTOMY TOTAL LAPAROSCOPIC/BILATERAL SALPINGECTOMY;  Surgeon: Will Bonnet, MD;  Location: [facility];  Service: Gynecology;  Laterality: N/A;   TONSILLECTOMY  1972   TUBAL LIGATION      Home Medications:   Allergies as of 12/12/2021       Reactions   Bee Venom    Bacitracin Itching, Rash   Bactrim [sulfamethoxazole-trimethoprim] Rash   Methenamine-sodium Salicylate Rash        Medication List        Accurate as of December 12, 2021 11:59 PM. If you have any questions, ask your nurse or doctor.          ALPRAZolam 0.25 MG tablet Commonly known as: XANAX   cholecalciferol 1000 units tablet Commonly known as: VITAMIN D Take 1,000 Units by mouth daily.   Cranberry 500 MG Caps Take by mouth.   Cyanocobalamin 1000 MCG Tbcr Take by mouth.   cyclobenzaprine 10 MG tablet Commonly known as: FLEXERIL Take 1 tablet (10 mg total) by mouth 3 (three) times daily as needed for muscle spasms.   estradiol 0.1 MG/GM vaginal cream Commonly known as: ESTRACE DISPENSE A SMALL PEA SIZE IN THE VAGINA AMOUNT ON MONDAY, WEDNESDAY AND FRIDAY   ferrous sulfate 325 (65 FE) MG tablet Take 325 mg by mouth every other day.   hyoscyamine 0.125 MG tablet Commonly known as: LEVSIN Take by mouth.   ibuprofen 600 MG tablet Commonly known as: ADVIL Take by mouth.   nitrofurantoin (macrocrystal-monohydrate) 100 MG capsule Commonly known as: MACROBID Take 1 capsule (100 mg total) by mouth every 12 (twelve) hours. Started by: Zara Council, PA-C   pantoprazole 40 MG tablet Commonly known as:  PROTONIX 40 mg daily.   predniSONE 10 MG tablet Commonly known as: DELTASONE Take 6 tabs p.o. on day 1 and decrease by 1 tablet daily until complete   valACYclovir 1000 MG tablet Commonly known as: VALTREX Take 1,000 mg by mouth every other day.        Allergies:  Allergies  Allergen Reactions   Bee Venom    Bacitracin Itching and Rash   Bactrim [Sulfamethoxazole-Trimethoprim] Rash   Methenamine-Sodium Salicylate Rash    Family History: Family History  Problem Relation Age of Onset   Hyperlipidemia Mother    Hypertension Mother    Hypertension Father    Hyperlipidemia Father     Kidney disease Neg Hx    Bladder Cancer Neg Hx    Breast cancer Neg Hx     Social History:  reports that she quit smoking about 10 years ago. Her smoking use included cigarettes. She has never used smokeless tobacco. She reports current alcohol use. She reports that she does not use drugs.  ROS: Pertinent ROS in HPI  Physical Exam: BP 106/74    Pulse 84    Ht 5\' 5"  (1.651 m)    Wt 164 lb (74.4 kg)    LMP 11/09/2015    BMI 27.29 kg/m   Constitutional:  Well nourished. Alert and oriented, No acute distress. HEENT: [place] AT, mask in place.  Trachea midline Cardiovascular: No clubbing, cyanosis, or edema. Respiratory: Normal respiratory effort, no increased work of breathing. Neurologic: Grossly intact, no focal deficits, moving all 4 extremities. Psychiatric: Normal mood and affect.  Laboratory Data: Urinalysis Results for orders placed or performed in visit on 12/12/21  CULTURE, URINE COMPREHENSIVE   Specimen: Urine   UR  Result Value Ref Range   Urine Culture, Comprehensive Preliminary report (A)    Organism ID, Bacteria Escherichia coli (A)   Microscopic Examination   Urine  Result Value Ref Range   WBC, UA >30 (A) 0 - 5 /hpf   RBC 3-10 (A) 0 - 2 /hpf   Epithelial Cells (non renal) 0-10 0 - 10 /hpf   Bacteria, UA Many (A) None seen/Few  Urinalysis, Complete  Result Value Ref Range   Specific Gravity, UA 1.015 1.005 - 1.030   pH, UA 6.0 5.0 - 7.5   Color, UA Yellow Yellow   Appearance Ur Cloudy (A) Clear   Leukocytes,UA 2+ (A) Negative   Protein,UA Negative Negative/Trace   Glucose, UA Negative Negative   Ketones, UA Negative Negative   RBC, UA 3+ (A) Negative   Bilirubin, UA Negative Negative   Urobilinogen, Ur 0.2 0.2 - 1.0 mg/dL   Nitrite, UA Negative Negative   Microscopic Examination See below:   I have reviewed the labs.   Pertinent Imaging: N/A  Assessment & Plan:    1. Dysuria -Urinalysis, Complete -CULTURE, URINE COMPREHENSIVE -nitrofurantoin,  macrocrystal-monohydrate, (MACROBID) 100 MG capsule; Take 1 capsule (100 mg total) by mouth every 12 (twelve) hours.  Dispense: 14 capsule; Refill: 0   Return in about 1 month (around 01/12/2022) for symptom recheck .  These notes generated with voice recognition software. I apologize for typographical errors.  Zara Council, PA-C  [facility] [address]  [place], [place] [address] [phone]

## 2021-12-15 NOTE — Progress Notes (Signed)
In and Out Catheterization  Patient is present today for a I & O catheterization due to UTI. Patient was cleaned and prepped in a sterile fashion with betadine . A 14FR cath was inserted no complications were noted , of urine return was noted, urine was yellow in color. A clean urine sample was collected for UA. Bladder was drained  And catheter was removed with out difficulty.    Performed by: Milas Kocher, CMA

## 2021-12-16 LAB — CULTURE, URINE COMPREHENSIVE

## 2021-12-26 ENCOUNTER — Ambulatory Visit: Payer: 59 | Admitting: Physician Assistant

## 2021-12-31 ENCOUNTER — Ambulatory Visit: Payer: 59 | Admitting: Urology

## 2022-01-01 NOTE — Progress Notes (Signed)
Error

## 2022-01-02 ENCOUNTER — Encounter: Payer: Self-pay | Admitting: Urology

## 2022-01-02 ENCOUNTER — Ambulatory Visit (INDEPENDENT_AMBULATORY_CARE_PROVIDER_SITE_OTHER): Payer: 59 | Admitting: Urology

## 2022-01-02 ENCOUNTER — Other Ambulatory Visit: Payer: Self-pay

## 2022-01-02 VITALS — BP 111/74 | HR 76 | Ht 62.0 in | Wt 161.0 lb

## 2022-01-02 DIAGNOSIS — R3129 Other microscopic hematuria: Secondary | ICD-10-CM | POA: Diagnosis not present

## 2022-01-02 DIAGNOSIS — N390 Urinary tract infection, site not specified: Secondary | ICD-10-CM

## 2022-01-02 LAB — MICROSCOPIC EXAMINATION
Epithelial Cells (non renal): NONE SEEN /hpf (ref 0–10)
RBC, Urine: NONE SEEN /hpf (ref 0–2)

## 2022-01-02 LAB — URINALYSIS, COMPLETE
Bilirubin, UA: NEGATIVE
Glucose, UA: NEGATIVE
Ketones, UA: NEGATIVE
Nitrite, UA: NEGATIVE
Protein,UA: NEGATIVE
RBC, UA: NEGATIVE
Specific Gravity, UA: 1.01 (ref 1.005–1.030)
Urobilinogen, Ur: 0.2 mg/dL (ref 0.2–1.0)
pH, UA: 6 (ref 5.0–7.5)

## 2022-01-02 NOTE — Progress Notes (Signed)
01/02/2022 9:50 PM   Christina Bates 1966/04/06 [id number]  Referring provider: Marinda Elk, MD [facility] [place],  [place] [address]  Chief Complaint  Patient presents with   Recurrent UTI   Hematuria   Urological history: 1. rUTI's -contributing factors of age, vaginal atrophy, sexually active and incontinence -RUS 2021 - NED -cysto 2021 - NED  -Documented positive urine cultures over the last year  12/12/2021 E.coli  11/05/2021 E.coli -managed with vaginal estrogen cream   HPI: Christina Bates is a 56 y.o. female who presents today for follow up after an UTI.   UA negative for micro heme, but with 6-10 WBC's and few bacteria  She is still experiencing frequency, urgency, dysuria and bilateral flank pain.  Patient denies any modifying or aggravating factors.  Patient denies any gross hematuria or suprapubic pain.  Patient denies any fevers, chills, nausea or vomiting.    PMH: Past Medical History:  Diagnosis Date   Anxiety    Complication of anesthesia    Hearing loss of both ears    Heartburn    HLD (hyperlipidemia)    HSV infection    PONV (postoperative nausea and vomiting)    RLS (restless legs syndrome)    Occurs on occasion, better now.    Surgical History: Past Surgical History:  Procedure Laterality Date   ABDOMINAL HYSTERECTOMY     [place] IMPLANT Left 2004   CYSTOSCOPY N/A 11/19/2015   Procedure: CYSTOSCOPY;  Surgeon: Will Bonnet, MD;  Location: [facility];  Service: Gynecology;  Laterality: N/A;   DILATION AND CURETTAGE OF UTERUS     ENDOMETRIAL ABLATION     FOOT SURGERY Left 2011   FOOT SURGERY Right 2008   HYSTEROSCOPY     LAPAROSCOPIC HYSTERECTOMY N/A 11/19/2015   Procedure: HYSTERECTOMY TOTAL LAPAROSCOPIC/BILATERAL SALPINGECTOMY;  Surgeon: Will Bonnet, MD;  Location: [facility] OR[facility];  Service: Gynecology;  Laterality: N/A;   TONSILLECTOMY  1972   TUBAL  LIGATION      Home Medications:  Allergies as of 01/02/2022       Reactions   Bee Venom    Bacitracin Itching, Rash   Bactrim [sulfamethoxazole-trimethoprim] Rash   Methenamine-sodium Salicylate Rash        Medication List        Accurate as of January 02, 2022 11:59 PM. If you have any questions, ask your nurse or doctor.          STOP taking these medications    nitrofurantoin (macrocrystal-monohydrate) 100 MG capsule Commonly known as: MACROBID Stopped by: Caffie Sotto, PA-C   predniSONE 10 MG tablet Commonly known as: DELTASONE Stopped by: Zara Council, PA-C       TAKE these medications    ALPRAZolam 0.25 MG tablet Commonly known as: XANAX   cholecalciferol 1000 units tablet Commonly known as: VITAMIN D Take 1,000 Units by mouth daily.   Cranberry 500 MG Caps Take by mouth.   Cyanocobalamin 1000 MCG Tbcr Take by mouth.   cyclobenzaprine 10 MG tablet Commonly known as: FLEXERIL Take 1 tablet (10 mg total) by mouth 3 (three) times daily as needed for muscle spasms.   estradiol 0.1 MG/GM vaginal cream Commonly known as: ESTRACE DISPENSE A SMALL PEA SIZE IN THE VAGINA AMOUNT ON MONDAY, WEDNESDAY AND FRIDAY   ferrous sulfate 325 (65 FE) MG tablet Take 325 mg by mouth every other day.   hyoscyamine 0.125 MG tablet  Commonly known as: LEVSIN Take by mouth.   ibuprofen 600 MG tablet Commonly known as: ADVIL Take by mouth.   pantoprazole 40 MG tablet Commonly known as: PROTONIX 40 mg daily.   valACYclovir 1000 MG tablet Commonly known as: VALTREX Take 1,000 mg by mouth every other day.        Allergies:  Allergies  Allergen Reactions   Bee Venom    Bacitracin Itching and Rash   Bactrim [Sulfamethoxazole-Trimethoprim] Rash   Methenamine-Sodium Salicylate Rash    Family History: Family History  Problem Relation Age of Onset   Hyperlipidemia Mother    Hypertension Mother    Hypertension Father    Hyperlipidemia Father     Kidney disease Neg Hx    Bladder Cancer Neg Hx    Breast cancer Neg Hx     Social History:  reports that she quit smoking about 10 years ago. Her smoking use included cigarettes. She has never used smokeless tobacco. She reports current alcohol use. She reports that she does not use drugs.  ROS: Pertinent ROS in HPI  Physical Exam: BP 111/74    Pulse 76    Ht _0  (1.575 m)    Wt 161 lb (73 kg)    LMP 11/09/2015    BMI 29.45 kg/m   Constitutional:  Well nourished. Alert and oriented, No acute distress. HEENT: [place] AT, mask in place.  Trachea midline Cardiovascular: No clubbing, cyanosis, or edema. Respiratory: Normal respiratory effort, no increased work of breathing. Neurologic: Grossly intact, no focal deficits, moving all 4 extremities. Psychiatric: Normal mood and affect.    Laboratory Data: Urinalysis Component     Latest Ref Rng & Units 01/02/2022  Specific Gravity, UA     1.005 - 1.030 1.010  pH, UA     5.0 - 7.5 6.0  Color, UA     Yellow Yellow  Appearance Ur     Clear Clear  Leukocytes,UA     Negative 1+ (A)  Protein,UA     Negative/Trace Negative  Glucose, UA     Negative Negative  Ketones, UA     Negative Negative  RBC, UA     Negative Negative  Bilirubin, UA     Negative Negative  Urobilinogen, Ur     0.2 - 1.0 mg/dL 0.2  Nitrite, UA     Negative Negative  Microscopic Examination      See below:   Component     Latest Ref Rng & Units 01/02/2022  WBC, UA     0 - 5 /hpf 6-10 (A)  RBC     0 - 2 /hpf None seen  Epithelial Cells (non renal)     0 - 10 /hpf None seen  Bacteria, UA     None seen/Few Few (A)  I have reviewed the labs.   Pertinent Imaging: N/A  Assessment & Plan:    1. Microscopic hematuria -UA negative for micro heme today  2. rUTI's - criteria for recurrent UTI has been met with 2 or more infections in 6 months or 3 or greater infections in one year  - patient is instructed to increase their water intake until the urine is  pale yellow or clear (10 to 12 cups daily)  - patient is instructed to take probiotics (yogurt, oral pills or vaginal suppositories), take cranberry pills or drink the juice and Vitamin C 1,000 mg daily to acidify the urine  - avoid soaking in tubs and wipe front to back after  urinating  -UA bland, but patient is symptomatic -urine sent for culture  -if urine culture is negative, would recommend KUB to evaluate for possible stone contributing to symptoms                                         Return for pending urine culture results .  These notes generated with voice recognition software. I apologize for typographical errors.  Zara Council, PA-C  [facility] [address]  [place], [place] [address] [phone]

## 2022-01-07 LAB — CULTURE, URINE COMPREHENSIVE

## 2022-01-08 ENCOUNTER — Other Ambulatory Visit: Payer: Self-pay | Admitting: Urology

## 2022-01-08 ENCOUNTER — Telehealth: Payer: Self-pay | Admitting: Family Medicine

## 2022-01-08 DIAGNOSIS — N390 Urinary tract infection, site not specified: Secondary | ICD-10-CM

## 2022-01-08 MED ORDER — AMOXICILLIN-POT CLAVULANATE 875-125 MG PO TABS: 1.0000 | ORAL_TABLET | Freq: Two times a day (BID) | ORAL | 0 refills | Status: DC

## 2022-01-08 NOTE — Telephone Encounter (Signed)
ABX sent to pharmacy. [facility] [address] is the correct pharmacy. [facility] [place] [facility] been taken out.

## 2022-01-08 NOTE — Telephone Encounter (Signed)
-----   Message from Sueanne Margarita, [place] sent at 01/08/2022  1:30 PM EST -----  ----- Message ----- From: Hulan Fray Sent: 01/08/2022   7:48 AM EST To: Sueanne Margarita, CMA  Please let Christina Bates know that her urine culture was positive for infection and we need for her to start Augmentin 875/125, twice daily for seven days.  I have not sent a prescription in for this as she has [facility] listed in her pharmacy preferences.  If her symptoms do not resolve after completion of this round of antibiotics, I would recommend we get a KUB to rule out any kidney stone causing recurrent infection.  If her symptoms do resolve after completing the antibiotic, I recommend she keep her follow-up appointment with Dr. Richardo Hanks in June.

## 2022-01-09 ENCOUNTER — Telehealth: Payer: Self-pay | Admitting: Urology

## 2022-01-09 MED ORDER — CIPROFLOXACIN HCL 500 MG PO TABS: 500.0000 mg | ORAL_TABLET | Freq: Two times a day (BID) | ORAL | 0 refills | Status: AC

## 2022-01-09 NOTE — Telephone Encounter (Signed)
Would you triage her and make sure her vomiting is from the antibiotic and not something else?   And we can send in Cipro 500 mg, twice daily for seven days.

## 2022-01-09 NOTE — Telephone Encounter (Signed)
Spoke with patients husband. She is not having any fevers, chills or any other symptoms. She states the vomiting starting since taking augmentin. I advised pt husband to have pt DC augmentin and to start Cipro 500mg  BID x 7 days. Pt husband verbalized understanding. RX sent to pharmacy.

## 2022-01-09 NOTE — Telephone Encounter (Signed)
Patient came in office and is throwing up from the Augmentin.  Rx can be called in to [place] on [address]

## 2022-01-09 NOTE — Telephone Encounter (Signed)
LMOM for pt/pt's husband to return call.

## 2022-04-07 ENCOUNTER — Other Ambulatory Visit: Payer: Self-pay | Admitting: Physician Assistant

## 2022-04-07 DIAGNOSIS — Z1231 Encounter for screening mammogram for malignant neoplasm of breast: Secondary | ICD-10-CM

## 2022-04-10 ENCOUNTER — Ambulatory Visit: Payer: 59 | Admitting: Physician Assistant

## 2022-04-10 ENCOUNTER — Encounter: Payer: Self-pay | Admitting: Physician Assistant

## 2022-04-10 VITALS — BP 116/78 | HR 77 | Ht 62.0 in | Wt 161.0 lb

## 2022-04-10 DIAGNOSIS — N390 Urinary tract infection, site not specified: Secondary | ICD-10-CM | POA: Diagnosis not present

## 2022-04-10 LAB — URINALYSIS, COMPLETE
Bilirubin, UA: NEGATIVE
Glucose, UA: NEGATIVE
Ketones, UA: NEGATIVE
Nitrite, UA: POSITIVE — AB
Protein,UA: NEGATIVE
Specific Gravity, UA: 1.02 (ref 1.005–1.030)
Urobilinogen, Ur: 1 mg/dL (ref 0.2–1.0)
pH, UA: 6.5 (ref 5.0–7.5)

## 2022-04-10 LAB — MICROSCOPIC EXAMINATION: WBC, UA: >30 /hpf — AB (ref 0–5)

## 2022-04-10 MED ORDER — CIPROFLOXACIN HCL 250 MG PO TABS: 250.0000 mg | ORAL_TABLET | Freq: Two times a day (BID) | ORAL | 0 refills | Status: AC

## 2022-04-10 MED ORDER — NITROFURANTOIN MONOHYD MACRO 100 MG PO CAPS: 100.0000 mg | ORAL_CAPSULE | Freq: Every day | ORAL | 0 refills | Status: AC

## 2022-04-10 NOTE — Progress Notes (Signed)
04/10/2022 9:56 AM   [name] 1966/08/08 [id number]  CC: Chief Complaint  Patient presents with   Dysuria   HPI: Christina Bates is a 56 y.o. female with PMH recurrent UTI on vaginal estrogen cream previously on suppressive Macrobid who presents today for evaluation of possible UTI.   Today she reports a 1 week history of dysuria, urgency, and frequency.  She denies fever, chills, nausea, vomiting, gross hematuria, and flank pain.  She has been taking Tylenol for symptom control.  She continues to use estrogen cream 3 times weekly.  She would like to resume suppressive antibiotics today.  She states she gets some red, itchy skin with treatment doses of Macrobid, but tolerates daily suppressive doses without issue.  In-office UA today positive for trace intact blood, nitrites, and 3+ leukocyte esterase; urine microscopy with >30 WBCs/HPF, 3-10 RBCs/HPF, and many bacteria.   PMH: Past Medical History:  Diagnosis Date   Anxiety    Complication of anesthesia    Hearing loss of both ears    Heartburn    HLD (hyperlipidemia)    HSV infection    PONV (postoperative nausea and vomiting)    RLS (restless legs syndrome)    Occurs on occasion, better now.    Surgical History: Past Surgical History:  Procedure Laterality Date   ABDOMINAL HYSTERECTOMY     CESAREAN SECTION  1992 & 1993   [place] IMPLANT Left 2004   CYSTOSCOPY N/A 11/19/2015   Procedure: CYSTOSCOPY;  Surgeon: Conard Novak, MD;  Location: [facility];  Service: Gynecology;  Laterality: N/A;   DILATION AND CURETTAGE OF UTERUS     ENDOMETRIAL ABLATION     FOOT SURGERY Left 2011   FOOT SURGERY Right 2008   HYSTEROSCOPY     LAPAROSCOPIC HYSTERECTOMY N/A 11/19/2015   Procedure: HYSTERECTOMY TOTAL LAPAROSCOPIC/BILATERAL SALPINGECTOMY;  Surgeon: Conard Novak, MD;  Location: [facility];  Service: Gynecology;  Laterality: N/A;   TONSILLECTOMY  1972   TUBAL LIGATION      Home Medications:  Allergies  as of 04/10/2022       Reactions   Bee Venom    Bacitracin Itching, Rash   Bactrim [sulfamethoxazole-trimethoprim] Rash   Methenamine-sodium Salicylate Rash        Medication List        Accurate as of Apr 10, 2022  9:56 AM. If you have any questions, ask your nurse or doctor.          ALPRAZolam 0.25 MG tablet Commonly known as: XANAX   cholecalciferol 1000 units tablet Commonly known as: VITAMIN D Take 1,000 Units by mouth daily.   ciprofloxacin 250 MG tablet Commonly known as: CIPRO Take 1 tablet (250 mg total) by mouth 2 (two) times daily for 5 days. Started by: Carman Ching, PA-C   Cranberry 500 MG Caps Take by mouth.   Cyanocobalamin 1000 MCG Tbcr Take by mouth.   cyclobenzaprine 10 MG tablet Commonly known as: FLEXERIL Take 1 tablet (10 mg total) by mouth 3 (three) times daily as needed for muscle spasms.   estradiol 0.1 MG/GM vaginal cream Commonly known as: ESTRACE DISPENSE A SMALL PEA SIZE IN THE VAGINA AMOUNT ON MONDAY, WEDNESDAY AND FRIDAY   ferrous sulfate 325 (65 FE) MG tablet Take 325 mg by mouth every other day.   hyoscyamine 0.125 MG tablet Commonly known as: LEVSIN Take by mouth.   ibuprofen 600 MG tablet Commonly known as: ADVIL Take by mouth.   nitrofurantoin (macrocrystal-monohydrate) 100 MG  capsule Commonly known as: MACROBID Take 1 capsule (100 mg total) by mouth daily. Started by: Carman Ching, PA-C   pantoprazole 40 MG tablet Commonly known as: PROTONIX 40 mg daily.   valACYclovir 1000 MG tablet Commonly known as: VALTREX Take 1,000 mg by mouth every other day.        Allergies:  Allergies  Allergen Reactions   Bee Venom    Bacitracin Itching and Rash   Bactrim [Sulfamethoxazole-Trimethoprim] Rash   Methenamine-Sodium Salicylate Rash    Family History: Family History  Problem Relation Age of Onset   Hyperlipidemia Mother    Hypertension Mother    Hypertension Father    Hyperlipidemia  Father    Kidney disease Neg Hx    Bladder Cancer Neg Hx    Breast cancer Neg Hx     Social History:   reports that she quit smoking about 10 years ago. Her smoking use included cigarettes. She has never used smokeless tobacco. She reports current alcohol use. She reports that she does not use drugs.  Physical Exam: BP 116/78   Pulse 77   Ht 5\' 2"  (1.575 m)   Wt 161 lb (73 kg)   LMP 11/09/2015   BMI 29.45 kg/m   Constitutional:  Alert and oriented, no acute distress, nontoxic appearing HEENT: [place], AT Cardiovascular: No clubbing, cyanosis, or edema Respiratory: Normal respiratory effort, no increased work of breathing Skin: No rashes, bruises or suspicious lesions Neurologic: Grossly intact, no focal deficits, moving all 4 extremities Psychiatric: Normal mood and affect  Laboratory Data: Results for orders placed or performed in visit on 04/10/22  Microscopic Examination   Urine  Result Value Ref Range   WBC, UA >30 (A) 0 - 5 /hpf   RBC 3-10 (A) 0 - 2 /hpf   Epithelial Cells (non renal) 0-10 0 - 10 /hpf   Bacteria, UA Many (A) None seen/Few  Urinalysis, Complete  Result Value Ref Range   Specific Gravity, UA 1.020 1.005 - 1.030   pH, UA 6.5 5.0 - 7.5   Color, UA Yellow Yellow   Appearance Ur Cloudy (A) Clear   Leukocytes,UA 3+ (A) Negative   Protein,UA Negative Negative/Trace   Glucose, UA Negative Negative   Ketones, UA Negative Negative   RBC, UA Trace (A) Negative   Bilirubin, UA Negative Negative   Urobilinogen, Ur 1.0 0.2 - 1.0 mg/dL   Nitrite, UA Positive (A) Negative   Microscopic Examination See below:    Assessment & Plan:   1. Recurrent UTI UA appears grossly infected today.  Will send for culture and start empiric Cipro.  Okay to resume suppressive Macrobid upon completion of treatment course of antibiotics.  We discussed that I recommend only a 3 to 9-month course of suppressive antibiotics, as long-term use may increase her risk for pulmonary fibrosis.   She is in agreement with this plan. - Urinalysis, Complete - CULTURE, URINE COMPREHENSIVE - nitrofurantoin, macrocrystal-monohydrate, (MACROBID) 100 MG capsule; Take 1 capsule (100 mg total) by mouth daily.  Dispense: 90 capsule; Refill: 0 - ciprofloxacin (CIPRO) 250 MG tablet; Take 1 tablet (250 mg total) by mouth 2 (two) times daily for 5 days.  Dispense: 10 tablet; Refill: 0  Return if symptoms worsen or fail to improve.  8-month, PA-C  [facility] [address] [place], [place] [phone]

## 2022-04-13 LAB — CULTURE, URINE COMPREHENSIVE

## 2022-04-23 ENCOUNTER — Ambulatory Visit: Payer: 59 | Admitting: Urology

## 2022-04-23 ENCOUNTER — Encounter: Payer: Self-pay | Admitting: Urology

## 2022-04-23 VITALS — BP 110/76 | HR 71 | Ht 62.0 in | Wt 158.0 lb

## 2022-04-23 DIAGNOSIS — N390 Urinary tract infection, site not specified: Secondary | ICD-10-CM | POA: Diagnosis not present

## 2022-04-23 NOTE — Progress Notes (Signed)
   04/23/2022 4:01 PM   [name] 1966/05/04 [id number]  Reason for visit: Follow up recurrent UTI  HPI: 56 year old female with recurrent UTIs who has been on maximal treatment with topical estrogen cream, cranberry tablets, and had negative work-up with a normal renal/bladder ultrasound and cystoscopy in 2021.  She had been doing well initially after a long course of low-dose prophylaxis, however had some increased stress and dehydration in December 2023 which set off a cycle of multiple UTIs.  Most recently she was treated by Carman Ching on 04/10/2022 with Cipro, and restarted on a 90-day course of low-dose Macrobid.  She has a allergy of rash to Hiprex.  Agree with continuing topical estrogen cream, cranberry tablets, and the 3 months of low-dose Macrobid.  We discussed repeating renal/bladder ultrasound and cystoscopy, and using shared decision making she opted to defer which I think is reasonable.  Continue low-dose Macrobid prophylaxis x90 days RTC 6 to 9 months with PA symptom check  Sondra Come, MD  [facility] [address], [address] [place], [place] [address] [phone]

## 2022-08-09 ENCOUNTER — Ambulatory Visit
Admission: RE | Admit: 2022-08-09 | Discharge: 2022-08-09 | Disposition: A | Discharge status: Home / Self Care | Payer: 59 | Source: Ambulatory Visit | Attending: Family Medicine | Admitting: Family Medicine

## 2022-08-09 VITALS — BP 116/77 | HR 67 | Temp 97.8°F | Resp 14 | Ht 62.0 in | Wt 158.1 lb

## 2022-08-09 DIAGNOSIS — N3000 Acute cystitis without hematuria: Secondary | ICD-10-CM | POA: Diagnosis not present

## 2022-08-09 LAB — URINALYSIS, MICROSCOPIC (REFLEX): RBC / HPF: NONE SEEN RBC/hpf (ref 0–5)

## 2022-08-09 LAB — URINALYSIS, ROUTINE W REFLEX MICROSCOPIC
Bilirubin Urine: NEGATIVE
Glucose, UA: NEGATIVE mg/dL
Hgb urine dipstick: NEGATIVE
Ketones, ur: NEGATIVE mg/dL
Nitrite: NEGATIVE
Protein, ur: NEGATIVE mg/dL
Specific Gravity, Urine: <1.005 — ABNORMAL LOW (ref 1.005–1.030)
pH: 5.5 (ref 5.0–8.0)

## 2022-08-09 MED ORDER — CIPROFLOXACIN HCL 500 MG PO TABS: 500.0000 mg | ORAL_TABLET | Freq: Two times a day (BID) | ORAL | 0 refills | Status: DC

## 2022-08-09 NOTE — ED Triage Notes (Signed)
Patient c/o dysuria and urgency that started couple days ago.

## 2022-08-09 NOTE — ED Provider Notes (Signed)
[facility]    CSN: [id number] Arrival date & time: 08/09/22  1149      History   Chief Complaint Chief Complaint  Patient presents with   Dysuria    HPI 56 year old female with a history of recurrent UTI presents with urinary symptoms.  Symptoms over the past couple of days.  She reports dysuria and urgency.  Has a history of recurrent UTI.  She is no longer on Macrobid for prophylaxis.  She reports some lower abdominal discomfort.  No fever.  No flank pain.  No relieving factors.  Past Medical History:  Diagnosis Date   Anxiety    Complication of anesthesia    Hearing loss of both ears    Heartburn    HLD (hyperlipidemia)    HSV infection    PONV (postoperative nausea and vomiting)    RLS (restless legs syndrome)    Occurs on occasion, better now.    Patient Active Problem List   Diagnosis Date Noted   Dizziness 02/26/2018   Degenerative joint disease of cervical and lumbar spine 02/23/2018   Lumbar radiculopathy 09/17/2017   Pure hypercholesterolemia 03/24/2016   Acute neck pain 12/31/2015   Acute pain of left hip 12/31/2015   Anxiety 12/31/2015   Menorrhagia with irregular cycle 11/19/2015   Dysmenorrhea 11/19/2015   Status post laparoscopic hysterectomy 11/19/2015   Recurrent genital herpes 08/15/2015   Vitamin B 12 deficiency 08/15/2015   Low vitamin B12 level 03/20/2015   Vitamin D deficiency 10/06/2014   Absolute anemia 10/05/2014   Gastro-esophageal reflux disease without esophagitis 10/05/2014    Past Surgical History:  Procedure Laterality Date   ABDOMINAL HYSTERECTOMY     [place] IMPLANT Left 2004   CYSTOSCOPY N/A 11/19/2015   Procedure: CYSTOSCOPY;  Surgeon: Will Bonnet, MD;  Location: [facility];  Service: Gynecology;  Laterality: N/A;   DILATION AND CURETTAGE OF UTERUS     ENDOMETRIAL ABLATION     FOOT SURGERY Left 2011   FOOT SURGERY Right 2008   HYSTEROSCOPY     LAPAROSCOPIC  HYSTERECTOMY N/A 11/19/2015   Procedure: HYSTERECTOMY TOTAL LAPAROSCOPIC/BILATERAL SALPINGECTOMY;  Surgeon: Will Bonnet, MD;  Location: [facility];  Service: Gynecology;  Laterality: N/A;   TONSILLECTOMY  1972   TUBAL LIGATION      OB History   No obstetric history on file.      Home Medications    Prior to Admission medications   Medication Sig Start Date End Date Taking? Authorizing Provider  ciprofloxacin (CIPRO) 500 MG tablet Take 1 tablet (500 mg total) by mouth 2 (two) times daily. 08/09/22  Yes Rossi Silvestro G, DO  estradiol (ESTRACE) 0.1 MG/GM vaginal cream DISPENSE A SMALL PEA SIZE IN THE VAGINA AMOUNT ON MONDAY, [facility] AND FRIDAY 01/20/21  Yes McGowan, Larene Beach A, PA-C  pantoprazole (PROTONIX) 40 MG tablet 40 mg daily.  02/05/15  Yes [provider]  valACYclovir (VALTREX) 1000 MG tablet Take 1,000 mg by mouth every other day.  03/09/15  Yes [provider]  ALPRAZolam Duanne Moron) 0.25 MG tablet  12/31/15   [provider]  cholecalciferol (VITAMIN D) 1000 UNITS tablet Take 1,000 Units by mouth daily.    [provider]  Cranberry 500 MG CAPS Take by mouth.    [provider]  Cyanocobalamin 1000 MCG TBCR Take by mouth.    [provider]  cyclobenzaprine (FLEXERIL) 10 MG tablet Take 1 tablet (10 mg total) by mouth  3 (three) times daily as needed for muscle spasms. 12/08/21   Eusebio Friendly B, PA-C  ferrous sulfate 325 (65 FE) MG tablet Take 325 mg by mouth every other day.     [provider]  hyoscyamine (LEVSIN) 0.125 MG tablet Take by mouth. 12/20/19   [provider]  ibuprofen (ADVIL,MOTRIN) 600 MG tablet Take by mouth. 11/20/15   [provider]    Family History Family History  Problem Relation Age of Onset   Hyperlipidemia Mother    Hypertension Mother    Hypertension Father    Hyperlipidemia Father    Kidney disease Neg Hx    Bladder Cancer Neg Hx    Breast cancer Neg Hx     Social  History Social History   Tobacco Use   Smoking status: Former    Types: Cigarettes    Quit date: 11/12/2011    Years since quitting: 10.7    Passive exposure: Past   Smokeless tobacco: Never  Vaping Use   Vaping Use: Former  Substance Use Topics   Alcohol use: Yes    Alcohol/week: 0.0 standard drinks of alcohol    Comment: 3-5 drinks per year   Drug use: No     Allergies   Bee venom, Bacitracin, Bactrim [sulfamethoxazole-trimethoprim], and Methenamine-sodium salicylate   Review of Systems Review of Systems  Constitutional: Negative.   Genitourinary:  Positive for dysuria and frequency.   Physical Exam Triage Vital Signs ED Triage Vitals  Enc Vitals Group     BP 08/09/22 1158 116/77     Pulse Rate 08/09/22 1158 67     Resp 08/09/22 1158 14     Temp 08/09/22 1158 97.8 F (36.6 C)     Temp Source 08/09/22 1158 Oral     SpO2 08/09/22 1158 96 %     Weight 08/09/22 1156 158 lb 1.1 oz (71.7 kg)     Height 08/09/22 1156 5\' 2"  (1.575 m)     Head Circumference --      Peak Flow --      Pain Score 08/09/22 1156 5     Pain Loc --      Pain Edu? --      Excl. in GC? --    No data found.  Updated Vital Signs BP 116/77 (BP Location: Right Arm)   Pulse 67   Temp 97.8 F (36.6 C) (Oral)   Resp 14   Ht 5\' 2"  (1.575 m)   Wt 71.7 kg   LMP 11/09/2015   SpO2 96%   BMI 28.91 kg/m   Visual Acuity Right Eye Distance:   Left Eye Distance:   Bilateral Distance:    Right Eye Near:   Left Eye Near:    Bilateral Near:     Physical Exam Vitals and nursing note reviewed.  Constitutional:      General: She is not in acute distress.    Appearance: Normal appearance.  HENT:     Head: Normocephalic and atraumatic.  Cardiovascular:     Rate and Rhythm: Normal rate and regular rhythm.  Pulmonary:     Effort: Pulmonary effort is normal.     Breath sounds: Normal breath sounds. No wheezing or rales.  Abdominal:     General: There is no distension.     Palpations:  Abdomen is soft.     Tenderness: There is no abdominal tenderness.  Neurological:     Mental Status: She is alert.    UC Treatments / Results  Labs (all labs ordered are listed, but only abnormal results are displayed) Labs Reviewed  URINALYSIS, ROUTINE W REFLEX MICROSCOPIC - Abnormal; Notable for the following components:      Result Value   APPearance HAZY (*)    Specific Gravity, Urine <1.005 (*)    Leukocytes,Ua SMALL (*)    All other components within normal limits  URINALYSIS, MICROSCOPIC (REFLEX) - Abnormal; Notable for the following components:   Bacteria, UA RARE (*)    All other components within normal limits  URINE CULTURE    EKG   Radiology No results found.  Procedures Procedures (including critical care time)  Medications Ordered in UC Medications - No data to display  Initial Impression / Assessment and Plan / UC Course  I have reviewed the triage vital signs and the nursing notes.  Pertinent labs & imaging results that were available during my care of the patient were reviewed by me and considered in my medical decision making (see chart for details).    56 year old female presents with UTI.  Patient has a history of recurrent UTI.  Treating with ciprofloxacin per patient request given recurrent nature of UTI and the fact that this medication works well.  She is followed by urology.  Final Clinical Impressions(s) / UC Diagnoses   Final diagnoses:  Acute cystitis without hematuria   Discharge Instructions   None    ED Prescriptions     Medication Sig Dispense Auth. Provider   ciprofloxacin (CIPRO) 500 MG tablet Take 1 tablet (500 mg total) by mouth 2 (two) times daily. 14 tablet Tommie Sams, DO      PDMP not reviewed this encounter.   Tommie Sams, [place] 08/09/22 1241

## 2022-09-18 ENCOUNTER — Ambulatory Visit
Admission: RE | Admit: 2022-09-18 | Discharge: 2022-09-18 | Disposition: A | Discharge status: Home / Self Care | Payer: 59 | Source: Ambulatory Visit | Attending: Physician Assistant | Admitting: Physician Assistant

## 2022-09-18 DIAGNOSIS — Z1231 Encounter for screening mammogram for malignant neoplasm of breast: Secondary | ICD-10-CM | POA: Diagnosis present

## 2022-12-30 ENCOUNTER — Ambulatory Visit (INDEPENDENT_AMBULATORY_CARE_PROVIDER_SITE_OTHER): Payer: 59

## 2022-12-30 ENCOUNTER — Encounter: Payer: Self-pay | Admitting: Podiatry

## 2022-12-30 ENCOUNTER — Ambulatory Visit: Payer: 59 | Admitting: Podiatry

## 2022-12-30 DIAGNOSIS — M722 Plantar fascial fibromatosis: Secondary | ICD-10-CM

## 2022-12-30 DIAGNOSIS — M778 Other enthesopathies, not elsewhere classified: Secondary | ICD-10-CM | POA: Diagnosis not present

## 2022-12-30 MED ORDER — TRIAMCINOLONE ACETONIDE 40 MG/ML IJ SUSP
40.0000 mg | Freq: Once | INTRAMUSCULAR | Status: AC
  Administered 2022-12-30: 40 mg

## 2022-12-30 MED ORDER — METHYLPREDNISOLONE 4 MG PO TBPK: ORAL_TABLET | ORAL | 0 refills | Status: DC

## 2022-12-30 NOTE — Progress Notes (Signed)
Subjective:  Patient ID: Christina Bates, female    DOB: [birth year],  MRN: [id number] HPI Chief Complaint  Patient presents with   Foot Pain    Lateral foot left - aching x few months, more intense and frequent pain recently, interested in orthotics, feet usually don't hurt with extra padding   New Patient (Initial Visit)    Est pt 04/2019    57 y.o. female presents with the above complaint.   ROS: Denies fever chills nausea vomit muscle aches pains calf pain back pain chest pain shortness of breath.  Past Medical History:  Diagnosis Date   Anxiety    Complication of anesthesia    Hearing loss of both ears    Heartburn    HLD (hyperlipidemia)    HSV infection    PONV (postoperative nausea and vomiting)    RLS (restless legs syndrome)    Occurs on occasion, better now.   Past Surgical History:  Procedure Laterality Date   ABDOMINAL HYSTERECTOMY     CESAREAN [place] IMPLANT Left 2004   CYSTOSCOPY N/A 11/19/2015   Procedure: CYSTOSCOPY;  Surgeon: Will Bonnet, MD;  Location: [facility];  Service: Gynecology;  Laterality: N/A;   DILATION AND CURETTAGE OF UTERUS     ENDOMETRIAL ABLATION     FOOT SURGERY Left 2011   FOOT SURGERY Right 2008   HYSTEROSCOPY     LAPAROSCOPIC HYSTERECTOMY N/A 11/19/2015   Procedure: HYSTERECTOMY TOTAL LAPAROSCOPIC/BILATERAL SALPINGECTOMY;  Surgeon: Will Bonnet, MD;  Location: [facility];  Service: Gynecology;  Laterality: N/A;   TONSILLECTOMY  1972   TUBAL LIGATION      Current Outpatient Medications:    methylPREDNISolone (MEDROL DOSEPAK) 4 MG TBPK tablet, 6 day dose pack - take as directed, Disp: 21 tablet, Rfl: 0   ALPRAZolam (XANAX) 0.25 MG tablet, , Disp: , Rfl:    cholecalciferol (VITAMIN D) 1000 UNITS tablet, Take 1,000 Units by mouth daily., Disp: , Rfl:    Cranberry 500 MG CAPS, Take by mouth., Disp: , Rfl:    estradiol (ESTRACE) 0.1 MG/GM vaginal cream, DISPENSE A SMALL PEA SIZE IN THE VAGINA AMOUNT  ON MONDAY, WEDNESDAY AND FRIDAY, Disp: 42.5 g, Rfl: 7   ferrous sulfate 325 (65 FE) MG tablet, Take 325 mg by mouth every other day. , Disp: , Rfl:    ibuprofen (ADVIL,MOTRIN) 600 MG tablet, Take by mouth., Disp: , Rfl:    pantoprazole (PROTONIX) 40 MG tablet, 40 mg daily. , Disp: , Rfl: 2   valACYclovir (VALTREX) 1000 MG tablet, Take 1,000 mg by mouth every other day. , Disp: , Rfl: 3  Allergies  Allergen Reactions   Bee Venom    Bacitracin Itching and Rash   Bactrim [Sulfamethoxazole-Trimethoprim] Rash   Methenamine-Sodium Salicylate Rash   Review of Systems Objective:  There were no vitals filed for this visit.  General: Well developed, nourished, in no acute distress, alert and oriented x3   Dermatological: Skin is warm, dry and supple bilateral. Nails x 10 are well maintained; remaining integument appears unremarkable at this time. There are no open sores, no preulcerative lesions, no rash or signs of infection present.  Vascular: Dorsalis Pedis artery and Posterior Tibial artery pedal pulses are 2/4 bilateral with immedate capillary fill time. Pedal hair growth present. No varicosities and no lower extremity edema present bilateral.   Neruologic: Grossly intact via light touch bilateral. Vibratory intact via tuning fork bilateral. Protective threshold with Semmes Wienstein monofilament  intact to all pedal sites bilateral. Patellar and Achilles deep tendon reflexes 2+ bilateral. No Babinski or clonus noted bilateral.   Musculoskeletal: No gross boney pedal deformities bilateral. No pain, crepitus, or limitation noted with foot and ankle range of motion bilateral. Muscular strength 5/5 in all groups tested bilateral.  Pain on palpation medial calcaneal tubercles bilateral heel the majority of her pain is located over the fourth fifth tarsometatarsal joint of her left foot as opposed to her right.  She also has tenderness in that area on frontal plane range of motion.  She also has  calluses to the forefoot bilateral.  No open lesions or wounds.  Gait: Unassisted, Nonantalgic.    Radiographs:  Radiographs demonstrate an osseously mature individual significant demineralization bilateral.  Plantar spurring bilateral soft tissue increase in density plantar fascial Caney insertion sites no acute findings.  Assessment & Plan:   Assessment: Planter fasciitis bilateral with lateral compensatory syndrome.  Plan: Injected the bilateral heels today 20 mg Kenalog 5 mg Marcaine and started her on methylprednisolone discussed appropriate shoe gear will follow-up with her as needed     Christina Bates T. [place], [place]

## 2023-01-28 ENCOUNTER — Ambulatory Visit: Payer: 59 | Admitting: Urology

## 2023-01-28 ENCOUNTER — Ambulatory Visit: Payer: 59 | Admitting: Physician Assistant

## 2023-01-29 ENCOUNTER — Ambulatory Visit: Payer: 59 | Admitting: Physician Assistant

## 2023-02-12 ENCOUNTER — Encounter: Payer: Self-pay | Admitting: Physician Assistant

## 2023-02-12 ENCOUNTER — Ambulatory Visit: Payer: 59 | Admitting: Physician Assistant

## 2023-02-12 VITALS — BP 107/71 | HR 66 | Ht 62.0 in | Wt 163.5 lb

## 2023-02-12 DIAGNOSIS — N390 Urinary tract infection, site not specified: Secondary | ICD-10-CM

## 2023-02-12 DIAGNOSIS — Z87440 Personal history of urinary (tract) infections: Secondary | ICD-10-CM | POA: Diagnosis not present

## 2023-02-12 NOTE — Progress Notes (Signed)
02/12/2023 1:59 PM   Christina Bates 1966/08/07 ME:[id number]  CC: Chief Complaint  Patient presents with   Recurrent UTI   HPI: Christina Bates is a 57 y.o. female with PMH recurrent UTI on vaginal estrogen cream previously on suppressive antibiotics who presents today for follow-up.   Today she reports she has been infection free x 6 months.  She attributes this to starting d-mannose.  She has no acute concerns today and is very pleased.  She continues to use vaginal estrogen cream but admits to missing an occasional dose.  PMH: Past Medical History:  Diagnosis Date   Anxiety    Complication of anesthesia    Hearing loss of both ears    Heartburn    HLD (hyperlipidemia)    HSV infection    PONV (postoperative nausea and vomiting)    RLS (restless legs syndrome)    Occurs on occasion, better now.    Surgical History: Past Surgical History:  Procedure Laterality Date   ABDOMINAL HYSTERECTOMY     [place] IMPLANT Left 2004   CYSTOSCOPY N/A 11/19/2015   Procedure: CYSTOSCOPY;  Surgeon: Will Bonnet, MD;  Location: [facility];  Service: Gynecology;  Laterality: N/A;   DILATION AND CURETTAGE OF UTERUS     ENDOMETRIAL ABLATION     FOOT SURGERY Left 2011   FOOT SURGERY Right 2008   HYSTEROSCOPY     LAPAROSCOPIC HYSTERECTOMY N/A 11/19/2015   Procedure: HYSTERECTOMY TOTAL LAPAROSCOPIC/BILATERAL SALPINGECTOMY;  Surgeon: Will Bonnet, MD;  Location: [facility];  Service: Gynecology;  Laterality: N/A;   TONSILLECTOMY  1972   TUBAL LIGATION      Home Medications:  Allergies as of 02/12/2023       Reactions   Bee Venom    Bacitracin Itching, Rash   Bactrim [sulfamethoxazole-trimethoprim] Rash   Methenamine-sodium Salicylate Rash        Medication List        Accurate as of February 12, 2023  1:59 PM. If you have any questions, ask your nurse or doctor.          ALPRAZolam 0.25 MG tablet Commonly known as: XANAX    cholecalciferol 1000 units tablet Commonly known as: VITAMIN D Take 1,000 Units by mouth daily.   Cranberry 500 MG Caps Take by mouth.   estradiol 0.1 MG/GM vaginal cream Commonly known as: ESTRACE DISPENSE A SMALL PEA SIZE IN THE VAGINA AMOUNT ON MONDAY, WEDNESDAY AND FRIDAY   ferrous sulfate 325 (65 FE) MG tablet Take 325 mg by mouth every other day.   ibuprofen 600 MG tablet Commonly known as: ADVIL Take by mouth.   methylPREDNISolone 4 MG Tbpk tablet Commonly known as: MEDROL DOSEPAK 6 day dose pack - take as directed   pantoprazole 40 MG tablet Commonly known as: PROTONIX 40 mg daily.   valACYclovir 1000 MG tablet Commonly known as: VALTREX Take 1,000 mg by mouth every other day.        Allergies:  Allergies  Allergen Reactions   Bee Venom    Bacitracin Itching and Rash   Bactrim [Sulfamethoxazole-Trimethoprim] Rash   Methenamine-Sodium Salicylate Rash    Family History: Family History  Problem Relation Age of Onset   Hyperlipidemia Mother    Hypertension Mother    Hypertension Father    Hyperlipidemia Father    Kidney disease Neg Hx    Bladder Cancer Neg Hx    Breast cancer Neg Hx  Social History:   reports that she quit smoking about 11 years ago. Her smoking use included cigarettes. She has been exposed to tobacco smoke. She has never used smokeless tobacco. She reports current alcohol use. She reports that she does not use drugs.  Physical Exam: BP 107/71   Pulse 66   Ht 5\' 2"  (1.575 m)   Wt 163 lb 8 oz (74.2 kg)   LMP 11/09/2015   BMI 29.90 kg/m   Constitutional:  Alert and oriented, no acute distress, nontoxic appearing HEENT: [place], AT Cardiovascular: No clubbing, cyanosis, or edema Respiratory: Normal respiratory effort, no increased work of breathing Skin: No rashes, bruises or suspicious lesions Neurologic: Grossly intact, no focal deficits, moving all 4 extremities Psychiatric: Normal mood and affect  Assessment & Plan:   1.  Recurrent UTI Infection free x 6 months on vaginal estrogen cream and d-mannose.  No longer on suppressive antibiotics.  Continue UTI prevention regimen as is, we will see her back in 1 year or sooner as needed.  Return in about 1 year (around 02/12/2024) for Annual rUTI f/u.  Debroah Loop, PA-C  [facility] [address], [place], [place] [address] [phone]

## 2023-02-22 MED ORDER — ESTRADIOL 0.1 MG/GM VA CREA: TOPICAL_CREAM | VAGINAL | 11 refills | Status: AC

## 2023-04-26 IMAGING — MG MM DIGITAL SCREENING BILAT W/ TOMO AND CAD
8 series · 8 of 24 positions shown · non-contrast
Comparison: Previous exam(s).

CLINICAL DATA: Screening.

EXAM:
DIGITAL SCREENING BILATERAL MAMMOGRAM WITH TOMOSYNTHESIS AND CAD
TECHNIQUE: Bilateral screening digital craniocaudal and mediolateral oblique
mammograms were obtained. Bilateral screening digital breast
tomosynthesis was performed. The images were evaluated with
computer-aided detection.

[R MLO synth-2D]
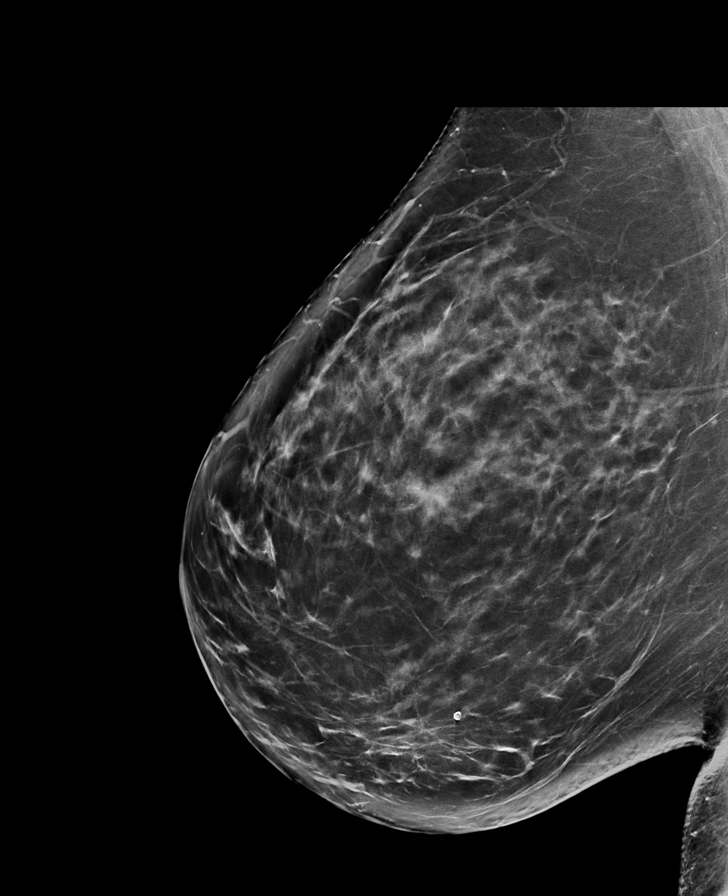

[L CC synth-2D]
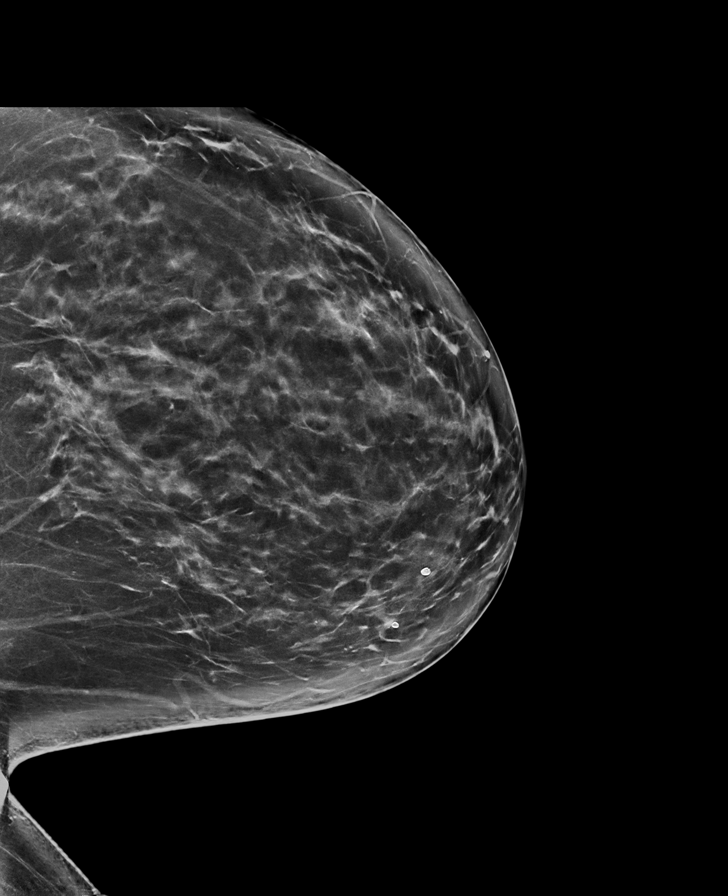

[R CC synth-2D]
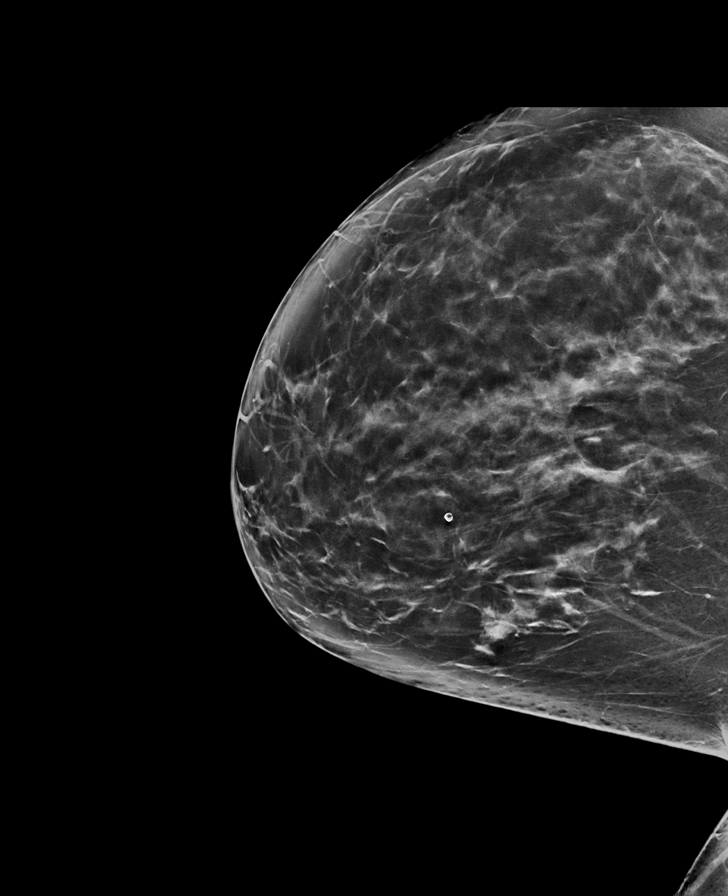

[L MLO synth-2D]
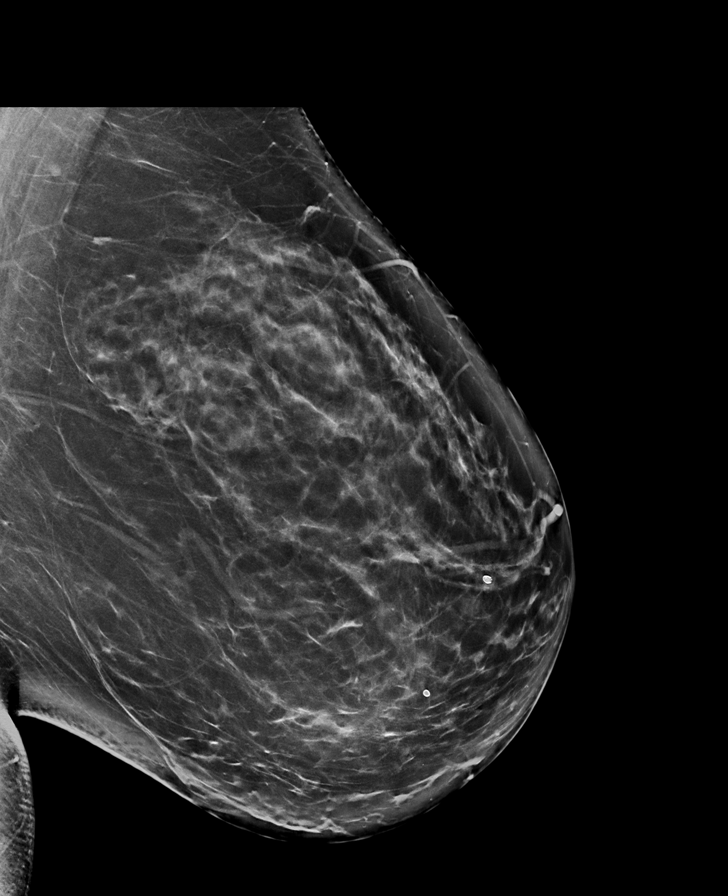

[R CC tomo · tomo slice 43/86.0]
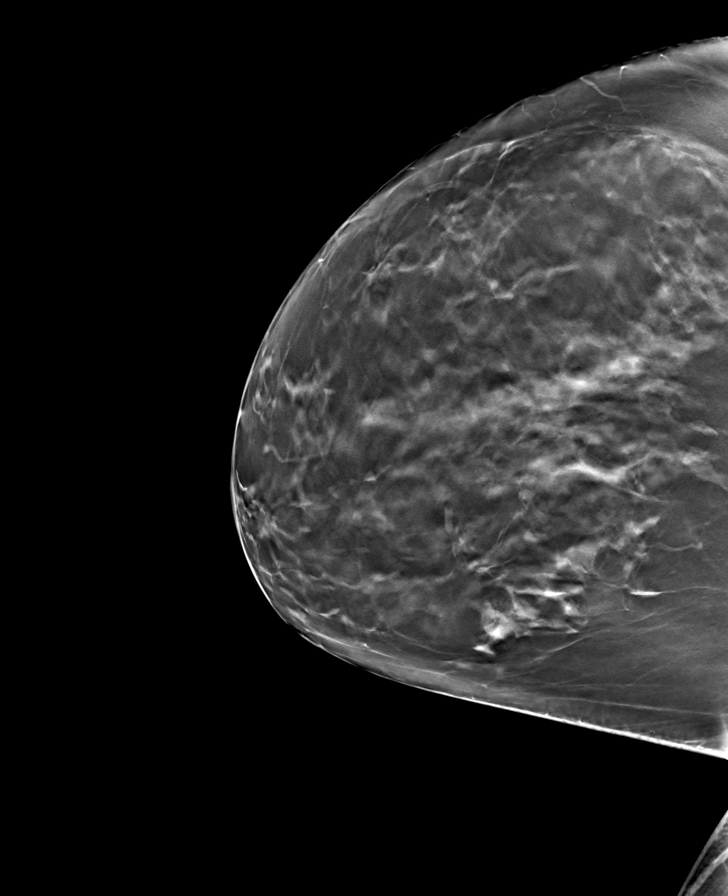

[L CC tomo · tomo slice 45/88.0]
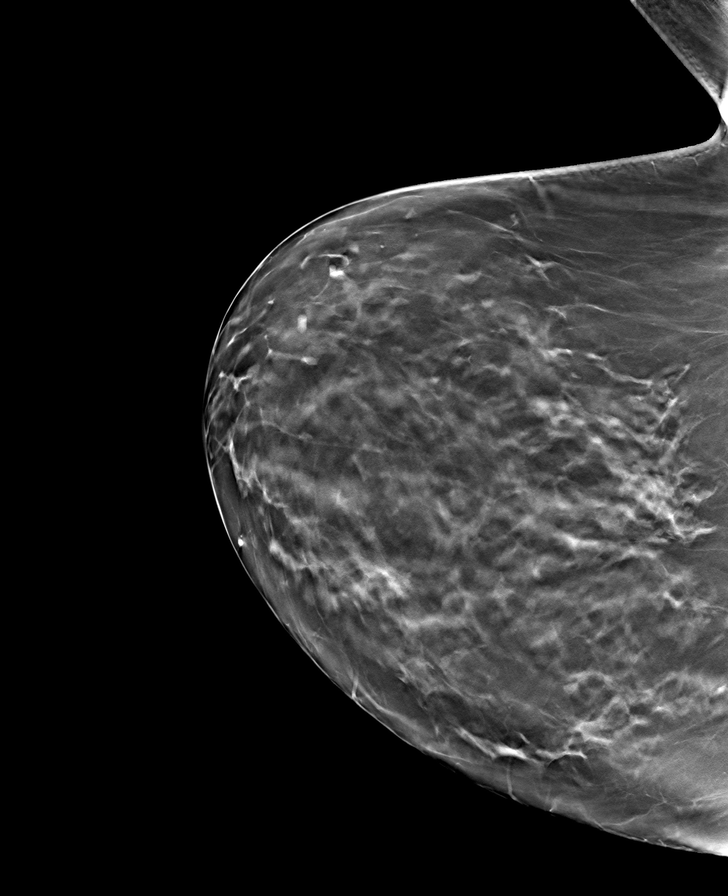

[R MLO tomo · tomo slice 43/85.0]
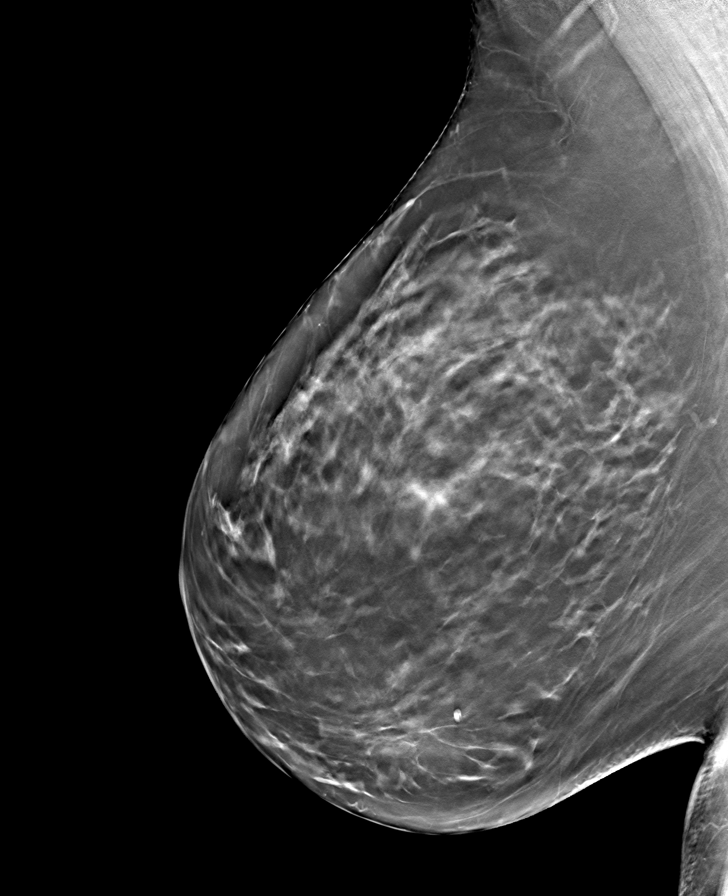

[L MLO tomo · tomo slice 45/89.0]
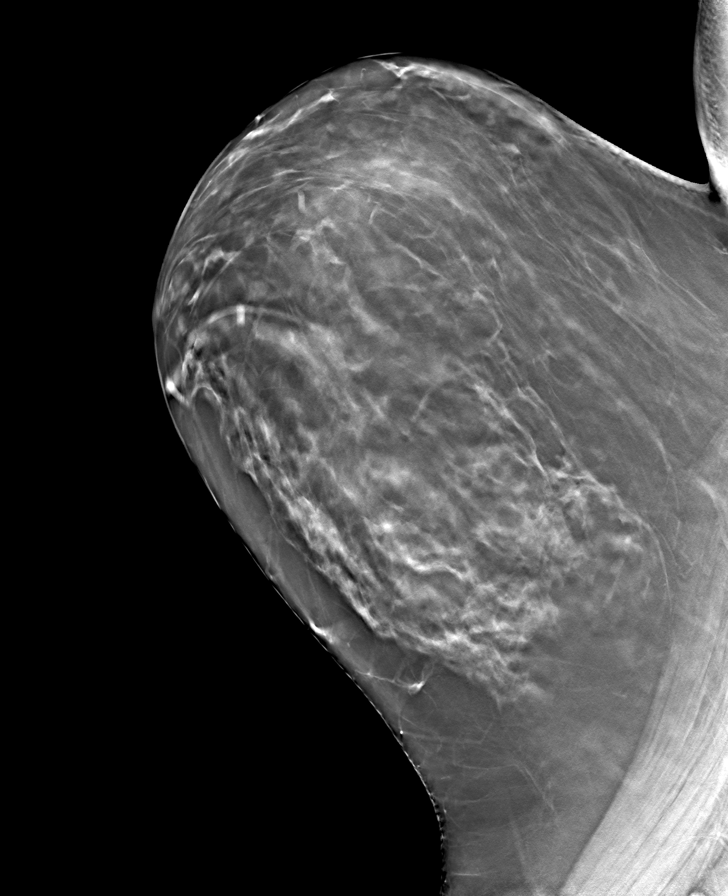

[8 of 24 positions shown; findings below may reference images not displayed]

ACR Breast Density Category c: The breast tissue is heterogeneously
dense, which may obscure small masses.
FINDINGS: There are no findings suspicious for malignancy.
IMPRESSION: No mammographic evidence of malignancy. A result letter of this
screening mammogram will be mailed directly to the patient.

RECOMMENDATION:
Screening mammogram in one year. (Code:Q3-W-BC3)

BI-RADS CATEGORY  1: Negative.

## 2023-08-06 ENCOUNTER — Other Ambulatory Visit: Payer: Self-pay | Admitting: Physician Assistant

## 2023-08-06 DIAGNOSIS — Z1231 Encounter for screening mammogram for malignant neoplasm of breast: Secondary | ICD-10-CM

## 2023-08-18 ENCOUNTER — Ambulatory Visit
Admission: RE | Admit: 2023-08-18 | Discharge: 2023-08-18 | Disposition: A | Discharge status: Home / Self Care | Payer: 59 | Source: Ambulatory Visit | Attending: Emergency Medicine | Admitting: Emergency Medicine

## 2023-08-18 VITALS — BP 113/80 | HR 68 | Temp 98.1°F | Resp 16 | Ht 62.0 in | Wt 158.0 lb

## 2023-08-18 DIAGNOSIS — B3731 Acute candidiasis of vulva and vagina: Secondary | ICD-10-CM

## 2023-08-18 LAB — URINALYSIS, W/ REFLEX TO CULTURE (INFECTION SUSPECTED)
Bilirubin Urine: NEGATIVE
Glucose, UA: NEGATIVE mg/dL
Hgb urine dipstick: NEGATIVE
Ketones, ur: NEGATIVE mg/dL
Leukocytes,Ua: NEGATIVE
Nitrite: NEGATIVE
Protein, ur: NEGATIVE mg/dL
RBC / HPF: NONE SEEN RBC/hpf (ref 0–5)
Specific Gravity, Urine: >1.03 — ABNORMAL HIGH (ref 1.005–1.030)
pH: 5.5 (ref 5.0–8.0)

## 2023-08-18 LAB — WET PREP, GENITAL
Clue Cells Wet Prep HPF POC: NONE SEEN
Sperm: NONE SEEN
Trich, Wet Prep: NONE SEEN
WBC, Wet Prep HPF POC: >10 — AB (ref <10)

## 2023-08-18 MED ORDER — FLUCONAZOLE 150 MG PO TABS: 150.0000 mg | ORAL_TABLET | ORAL | 0 refills | Status: AC

## 2023-08-18 NOTE — ED Triage Notes (Signed)
Pt c/o urinary freq,burning & pain x3 days. Denies any hematuria.

## 2023-08-18 NOTE — Discharge Instructions (Addendum)
Your testing today revealed that you have a vaginal yeast infection which is causing your urinary symptoms.  Please take the Diflucan for treatment of your vaginal yeast infection.  You will take 1 tablet today and repeat dosing every 3 days for total of 3 doses.  If you develop any new or worsening symptoms please return for reevaluation or see your PCP.

## 2023-08-18 NOTE — ED Provider Notes (Signed)
[facility]    CSN: [id number] Arrival date & time: 08/18/23  1619      History   Chief Complaint Chief Complaint  Patient presents with   Dysuria    Appt    HPI Christina Bates is a 57 y.o. female.   HPI  57 year old female with past medical history significant for restless leg syndrome, hyperlipidemia, bilateral hearing loss, and anxiety presents for evaluation of UTI symptoms that started 3 days ago.  She states that she has had increased urgency and frequency, nocturia, and also experiences burning with urination.  She has not seen any blood in her urine and she denies low back pain, nausea, or vomiting.  Also no fever.  Past Medical History:  Diagnosis Date   Anxiety    Complication of anesthesia    Hearing loss of both ears    Heartburn    HLD (hyperlipidemia)    HSV infection    PONV (postoperative nausea and vomiting)    RLS (restless legs syndrome)    Occurs on occasion, better now.    Patient Active Problem List   Diagnosis Date Noted   Cochlear implant in place 11/25/2020   Dizziness 02/26/2018   Degenerative joint disease of cervical and lumbar spine 02/23/2018   Lumbar radiculopathy 09/17/2017   Pure hypercholesterolemia 03/24/2016   Acute neck pain 12/31/2015   Acute pain of left hip 12/31/2015   Anxiety 12/31/2015   Menorrhagia with irregular cycle 11/19/2015   Dysmenorrhea 11/19/2015   Status post laparoscopic hysterectomy 11/19/2015   Recurrent genital herpes 08/15/2015   Vitamin B 12 deficiency 08/15/2015   Low vitamin B12 level 03/20/2015   Vitamin D deficiency 10/06/2014   Absolute anemia 10/05/2014   Gastro-esophageal reflux disease without esophagitis 10/05/2014    Past Surgical History:  Procedure Laterality Date   ABDOMINAL HYSTERECTOMY     CESAREAN SECTION  1992 & 1993   [place] IMPLANT Left 2004   CYSTOSCOPY N/A 11/19/2015   Procedure: CYSTOSCOPY;  Surgeon: Conard Novak, MD;  Location: [facility];  Service:  Gynecology;  Laterality: N/A;   DILATION AND CURETTAGE OF UTERUS     ENDOMETRIAL ABLATION     FOOT SURGERY Left 2011   FOOT SURGERY Right 2008   HYSTEROSCOPY     LAPAROSCOPIC HYSTERECTOMY N/A 11/19/2015   Procedure: HYSTERECTOMY TOTAL LAPAROSCOPIC/BILATERAL SALPINGECTOMY;  Surgeon: Conard Novak, MD;  Location: [facility];  Service: Gynecology;  Laterality: N/A;   TONSILLECTOMY  1972   TUBAL LIGATION      OB History   No obstetric history on file.      Home Medications    Prior to Admission medications   Medication Sig Start Date End Date Taking? Authorizing Provider  ALPRAZolam Prudy Feeler) 0.25 MG tablet  12/31/15  Yes [provider]  cholecalciferol (VITAMIN D) 1000 UNITS tablet Take 1,000 Units by mouth daily.   Yes [provider]  Cranberry 500 MG CAPS Take by mouth.   Yes [provider]  estradiol (ESTRACE) 0.1 MG/GM vaginal cream Estrogen Cream Instruction Discard applicator Apply pea sized amount to tip of finger to urethra before bed. Wash hands well after application. Use Monday, Wednesday and Friday 02/22/23  Yes Vaillancourt, Lelon Mast, PA-C  ferrous sulfate 325 (65 FE) MG tablet Take 325 mg by mouth every other day.    Yes [provider]  fluconazole (DIFLUCAN) 150 MG tablet Take 1 tablet (150 mg total) by mouth every 3 (three) days for 3 doses. 08/18/23 08/25/23  Yes Becky Augusta, NP  ibuprofen (ADVIL,MOTRIN) 600 MG tablet Take by mouth. 11/20/15  Yes [provider]  pantoprazole (PROTONIX) 40 MG tablet 40 mg daily.  02/05/15  Yes [provider]  valACYclovir (VALTREX) 1000 MG tablet Take 1,000 mg by mouth every other day.  03/09/15  Yes [provider]    Family History Family History  Problem Relation Age of Onset   Hyperlipidemia Mother    Hypertension Mother    Hypertension Father    Hyperlipidemia Father    Kidney disease Neg Hx    Bladder Cancer Neg Hx    Breast cancer Neg Hx     Social  History Social History   Tobacco Use   Smoking status: Former    Current packs/day: 0.00    Types: Cigarettes    Quit date: 11/12/2011    Years since quitting: 11.7    Passive exposure: Past   Smokeless tobacco: Never  Vaping Use   Vaping status: Former  Substance Use Topics   Alcohol use: Yes    Alcohol/week: 0.0 standard drinks of alcohol    Comment: 3-5 drinks per year   Drug use: No     Allergies   Bee venom, Bacitracin, Bactrim [sulfamethoxazole-trimethoprim], and Methenamine-sodium salicylate   Review of Systems Review of Systems  Constitutional:  Negative for fever.  Gastrointestinal:  Negative for abdominal pain, nausea and vomiting.  Genitourinary:  Positive for dysuria, frequency and urgency. Negative for hematuria.  Musculoskeletal:  Negative for back pain.     Physical Exam Triage Vital Signs ED Triage Vitals  Encounter Vitals Group     BP 08/18/23 1638 113/80     Systolic BP Percentile --      Diastolic BP Percentile --      Pulse Rate 08/18/23 1638 68     Resp 08/18/23 1638 16     Temp 08/18/23 1638 98.1 F (36.7 C)     Temp Source 08/18/23 1638 Oral     SpO2 08/18/23 1638 96 %     Weight 08/18/23 1638 158 lb (71.7 kg)     Height 08/18/23 1638 5\' 2"  (1.575 m)     Head Circumference --      Peak Flow --      Pain Score 08/18/23 1640 0     Pain Loc --      Pain Education --      Exclude from Growth Chart --    No data found.  Updated Vital Signs BP 113/80 (BP Location: Right Arm)   Pulse 68   Temp 98.1 F (36.7 C) (Oral)   Resp 16   Ht 5\' 2"  (1.575 m)   Wt 158 lb (71.7 kg)   LMP 11/09/2015   SpO2 96%   BMI 28.90 kg/m   Visual Acuity Right Eye Distance:   Left Eye Distance:   Bilateral Distance:    Right Eye Near:   Left Eye Near:    Bilateral Near:     Physical Exam Vitals and nursing note reviewed.  Constitutional:      Appearance: Normal appearance. She is not ill-appearing.  HENT:     Head: Normocephalic and  atraumatic.  Cardiovascular:     Rate and Rhythm: Normal rate and regular rhythm.     Pulses: Normal pulses.     Heart sounds: Normal heart sounds. No murmur heard.    No friction rub. No gallop.  Pulmonary:     Effort: Pulmonary effort is normal.  Breath sounds: Normal breath sounds. No wheezing, rhonchi or rales.  Abdominal:     Tenderness: There is no right CVA tenderness or left CVA tenderness.  Skin:    General: Skin is warm and dry.     Capillary Refill: Capillary refill takes less than 2 seconds.  Neurological:     General: No focal deficit present.     Mental Status: She is alert and oriented to person, place, and time.      UC Treatments / Results  Labs (all labs ordered are listed, but only abnormal results are displayed) Labs Reviewed  WET PREP, GENITAL - Abnormal; Notable for the following components:      Result Value   Yeast Wet Prep HPF POC PRESENT (*)    WBC, Wet Prep HPF POC >10 (*)    All other components within normal limits  URINALYSIS, W/ REFLEX TO CULTURE (INFECTION SUSPECTED) - Abnormal; Notable for the following components:   Specific Gravity, Urine >1.030 (*)    Bacteria, UA MANY (*)    All other components within normal limits  URINE CULTURE    EKG   Radiology No results found.  Procedures Procedures (including critical care time)  Medications Ordered in UC Medications - No data to display  Initial Impression / Assessment and Plan / UC Course  I have reviewed the triage vital signs and the nursing notes.  Pertinent labs & imaging results that were available during my care of the patient were reviewed by me and considered in my medical decision making (see chart for details).   Patient is a pleasant, nontoxic-appearing 57 year old female presenting for evaluation of 3 days worth of UTI symptoms as outlined in HPI above.  Her physical exam is largely unremarkable and she has no CVA tenderness.  I will order a urinalysis to evaluate for  the presence of infection.  Urinalysis shows a high specific gravity but it is negative for leukocyte esterase, nitrates, or protein.  Also no blood.  Reflex micro shows 11-20 WBCs with many bacteria and budding yeast.  Urine will reflex to culture.  I will order a vaginal wet prep.  Wet prep is positive for yeast.  I will discharge patient home with a diagnosis of vaginal yeast infection and treat her with Diflucan 150 mg tablets, 1 tablet now and repeat dosing every 3 days for 3 doses.   Final Clinical Impressions(s) / UC Diagnoses   Final diagnoses:  Vaginal yeast infection     Discharge Instructions      Your testing today revealed that you have a vaginal yeast infection which is causing your urinary symptoms.  Please take the Diflucan for treatment of your vaginal yeast infection.  You will take 1 tablet today and repeat dosing every 3 days for total of 3 doses.  If you develop any new or worsening symptoms please return for reevaluation or see your PCP.     ED Prescriptions     Medication Sig Dispense Auth. Provider   fluconazole (DIFLUCAN) 150 MG tablet Take 1 tablet (150 mg total) by mouth every 3 (three) days for 3 doses. 3 tablet Becky Augusta, NP      PDMP not reviewed this encounter.   Becky Augusta, NP 08/18/23 1714

## 2023-08-21 ENCOUNTER — Telehealth: Payer: Self-pay

## 2023-08-21 LAB — URINE CULTURE: Culture: 80000 — AB

## 2023-08-21 MED ORDER — NITROFURANTOIN MONOHYD MACRO 100 MG PO CAPS: 100.0000 mg | ORAL_CAPSULE | Freq: Two times a day (BID) | ORAL | 0 refills | Status: DC

## 2023-08-21 NOTE — Telephone Encounter (Signed)
Per VO by L. Lequita Halt, PA-C, Macrobid 100mg  BID x5 days.  Attempted to reach patient x1. LVM.  Rx sent to pharmacy on file.

## 2023-09-24 ENCOUNTER — Ambulatory Visit
Admission: RE | Admit: 2023-09-24 | Discharge: 2023-09-24 | Disposition: A | Discharge status: Home / Self Care | Payer: 59 | Source: Ambulatory Visit | Attending: Physician Assistant | Admitting: Physician Assistant

## 2023-09-24 DIAGNOSIS — Z1231 Encounter for screening mammogram for malignant neoplasm of breast: Secondary | ICD-10-CM | POA: Insufficient documentation

## 2024-02-04 ENCOUNTER — Ambulatory Visit: Payer: 59 | Admitting: Physician Assistant

## 2024-02-04 VITALS — BP 107/73 | HR 73

## 2024-02-04 DIAGNOSIS — N390 Urinary tract infection, site not specified: Secondary | ICD-10-CM | POA: Diagnosis not present

## 2024-02-04 DIAGNOSIS — R3911 Hesitancy of micturition: Secondary | ICD-10-CM

## 2024-02-04 LAB — MICROSCOPIC EXAMINATION: Bacteria, UA: NONE SEEN

## 2024-02-04 LAB — URINALYSIS, COMPLETE
Bilirubin, UA: NEGATIVE
Glucose, UA: NEGATIVE
Leukocytes,UA: NEGATIVE
Nitrite, UA: NEGATIVE
Protein,UA: NEGATIVE
RBC, UA: NEGATIVE
Specific Gravity, UA: >1.03 — ABNORMAL HIGH (ref 1.005–1.030)
Urobilinogen, Ur: 0.2 mg/dL (ref 0.2–1.0)
pH, UA: 5 (ref 5.0–7.5)

## 2024-02-04 NOTE — Progress Notes (Signed)
 02/04/2024 3:32 PM   [name] 1966/10/11 [id number]  CC: Chief Complaint  Patient presents with   Follow-up   HPI: Christina Bates is a 58 y.o. female with PMH recurrent UTI on vaginal estrogen cream and d-mannose who presents today for annual follow-up.   Today she reports 1 breakthrough infection about 6 months ago.  It occurred after a trip to [place], and she has some suspicions about the water that she bathes and while she was there.  Urine culture grew Bactrim resistant E. coli.    She is asymptomatic today.  She admits to occasional urinary hesitancy, but overall feels she empties well and declines bladder scan today.  In-office UA today positive for trace ketones; urine microscopy pan negative.  PMH: Past Medical History:  Diagnosis Date   Anxiety    Complication of anesthesia    Hearing loss of both ears    Heartburn    HLD (hyperlipidemia)    HSV infection    PONV (postoperative nausea and vomiting)    RLS (restless legs syndrome)    Occurs on occasion, better now.    Surgical History: Past Surgical History:  Procedure Laterality Date   ABDOMINAL HYSTERECTOMY     CESAREAN SECTION  1992 & 1993   COCHLEAR IMPLANT Left 2004   CYSTOSCOPY N/A 11/19/2015   Procedure: CYSTOSCOPY;  Surgeon: Conard Novak, MD;  Location: [facility];  Service: Gynecology;  Laterality: N/A;   DILATION AND CURETTAGE OF UTERUS     ENDOMETRIAL ABLATION     FOOT SURGERY Left 2011   FOOT SURGERY Right 2008   HYSTEROSCOPY     LAPAROSCOPIC HYSTERECTOMY N/A 11/19/2015   Procedure: HYSTERECTOMY TOTAL LAPAROSCOPIC/BILATERAL SALPINGECTOMY;  Surgeon: Conard Novak, MD;  Location: [facility];  Service: Gynecology;  Laterality: N/A;   TONSILLECTOMY  1972   TUBAL LIGATION      Home Medications:  Allergies as of 02/04/2024       Reactions   Bee Venom    Bacitracin Itching, Rash   Bactrim [sulfamethoxazole-trimethoprim] Rash   Methenamine-sodium Salicylate Rash         Medication List        Accurate as of February 04, 2024  3:32 PM. If you have any questions, ask your nurse or doctor.          ALPRAZolam 0.25 MG tablet Commonly known as: XANAX   cholecalciferol 1000 units tablet Commonly known as: VITAMIN D Take 1,000 Units by mouth daily.   Cranberry 500 MG Caps Take by mouth.   estradiol 0.1 MG/GM vaginal cream Commonly known as: ESTRACE Estrogen Cream Instruction Discard applicator Apply pea sized amount to tip of finger to urethra before bed. Wash hands well after application. Use Monday, Wednesday and Friday   ferrous sulfate 325 (65 FE) MG tablet Take 325 mg by mouth every other day.   ibuprofen 600 MG tablet Commonly known as: ADVIL Take by mouth.   nitrofurantoin (macrocrystal-monohydrate) 100 MG capsule Commonly known as: MACROBID Take 1 capsule (100 mg total) by mouth 2 (two) times daily.   pantoprazole 40 MG tablet Commonly known as: PROTONIX 40 mg daily.   valACYclovir 1000 MG tablet Commonly known as: VALTREX Take 1,000 mg by mouth every other day.        Allergies:  Allergies  Allergen Reactions   Bee Venom    Bacitracin Itching and Rash   Bactrim [Sulfamethoxazole-Trimethoprim] Rash   Methenamine-Sodium Salicylate Rash    Family History: Family  History  Problem Relation Age of Onset   Hyperlipidemia Mother    Hypertension Mother    Hypertension Father    Hyperlipidemia Father    Kidney disease Neg Hx    Bladder Cancer Neg Hx    Breast cancer Neg Hx     Social History:   reports that she quit smoking about 12 years ago. Her smoking use included cigarettes. She has been exposed to tobacco smoke. She has never used smokeless tobacco. She reports current alcohol use. She reports that she does not use drugs.  Physical Exam: BP 107/73   Pulse 73   LMP 11/09/2015   Constitutional:  Alert and oriented, no acute distress, nontoxic appearing HEENT: [place], AT Cardiovascular: No clubbing,  cyanosis, or edema Respiratory: Normal respiratory effort, no increased work of breathing Skin: No rashes, bruises or suspicious lesions Neurologic: Grossly intact, no focal deficits, moving all 4 extremities Psychiatric: Normal mood and affect  Laboratory Data: Results for orders placed or performed in visit on 02/04/24  Microscopic Examination   Collection Time: 02/04/24  2:49 PM   Urine  Result Value Ref Range   WBC, UA 0-5 0 - 5 /hpf   RBC, Urine 0-2 0 - 2 /hpf   Epithelial Cells (non renal) 0-10 0 - 10 /hpf   Mucus, UA Present (A) Not Estab.   Bacteria, UA None seen None seen/Few  Urinalysis, Complete   Collection Time: 02/04/24  2:49 PM  Result Value Ref Range   Specific Gravity, UA >1.030 (H) 1.005 - 1.030   pH, UA 5.0 5.0 - 7.5   Color, UA Yellow Yellow   Appearance Ur Hazy (A) Clear   Leukocytes,UA Negative Negative   Protein,UA Negative Negative/Trace   Glucose, UA Negative Negative   Ketones, UA Trace (A) Negative   RBC, UA Negative Negative   Bilirubin, UA Negative Negative   Urobilinogen, Ur 0.2 0.2 - 1.0 mg/dL   Nitrite, UA Negative Negative   Microscopic Examination See below:    Assessment & Plan:   1. Recurrent UTI (Primary) Well-controlled on topical vaginal estrogen cream and d-mannose supplements.  We will continue these and I will see her back next year. - Urinalysis, Complete  2. Urinary hesitancy Intermittent, minimally bothersome, and not associated with a sensation of incomplete voiding.  Will continue to monitor and plan for PVR in the future if this becomes more noticeable.  Return in about 1 year (around 02/03/2025) for Annual rUTI follow up.  Carman Ching, PA-C  [facility] [place] [address], [address] [place], [place] [address] [phone]

## 2024-02-11 ENCOUNTER — Ambulatory Visit: Payer: Self-pay | Admitting: Physician Assistant

## 2024-04-19 ENCOUNTER — Ambulatory Visit
Admission: RE | Admit: 2024-04-19 | Discharge: 2024-04-19 | Disposition: A | Discharge status: Home / Self Care | Source: Ambulatory Visit

## 2024-04-19 VITALS — BP 103/70 | HR 68 | Temp 97.8°F | Resp 16

## 2024-04-19 DIAGNOSIS — N3000 Acute cystitis without hematuria: Secondary | ICD-10-CM

## 2024-04-19 LAB — URINALYSIS, W/ REFLEX TO CULTURE (INFECTION SUSPECTED)
Bilirubin Urine: NEGATIVE
Glucose, UA: NEGATIVE mg/dL
Hgb urine dipstick: NEGATIVE
Ketones, ur: 15 mg/dL — AB
Nitrite: NEGATIVE
Protein, ur: NEGATIVE mg/dL
RBC / HPF: NONE SEEN RBC/hpf (ref 0–5)
Specific Gravity, Urine: 1.025 (ref 1.005–1.030)
pH: 5.5 (ref 5.0–8.0)

## 2024-04-19 MED ORDER — CIPROFLOXACIN HCL 250 MG PO TABS: 250.0000 mg | ORAL_TABLET | Freq: Two times a day (BID) | ORAL | 0 refills | Status: AC

## 2024-04-19 MED ORDER — PHENAZOPYRIDINE HCL 200 MG PO TABS: 200.0000 mg | ORAL_TABLET | Freq: Three times a day (TID) | ORAL | 0 refills | Status: AC

## 2024-04-19 NOTE — ED Triage Notes (Signed)
Pt presents with dysuria and urinary frequency x 3 days  °

## 2024-04-19 NOTE — ED Provider Notes (Signed)
 [facility]  Note:  This document was prepared using Conservation officer, historic buildings and may include unintentional dictation errors.  MRN: [id number] DOB: 1966-02-10  Subjective:   Christina Bates is a 58 y.o. female presenting for dysuria and increased urinary frequency x 3 days.  Patient denies any severe abdominal pain, flank pain, fever.  Patient is not taking any over-the-counter medication to treat symptoms.  Patient has past history of UTIs most recent was in September.  No current facility-administered medications for this encounter.  Current Outpatient Medications:    ciprofloxacin  (CIPRO ) 250 MG tablet, Take 1 tablet (250 mg total) by mouth every 12 (twelve) hours for 3 days., Disp: 6 tablet, Rfl: 0   phenazopyridine  (PYRIDIUM ) 200 MG tablet, Take 1 tablet (200 mg total) by mouth 3 (three) times daily., Disp: 6 tablet, Rfl: 0   ALPRAZolam (XANAX) 0.25 MG tablet, , Disp: , Rfl:    cholecalciferol (VITAMIN D) 1000 UNITS tablet, Take 1,000 Units by mouth daily., Disp: , Rfl:    Cranberry 500 MG CAPS, Take by mouth., Disp: , Rfl:    estradiol  (ESTRACE ) 0.1 MG/GM vaginal cream, Estrogen Cream Instruction Discard applicator Apply pea sized amount to tip of finger to urethra before bed. Wash hands well after application. Use Monday, Wednesday and Friday, Disp: 42.5 g, Rfl: 11   ferrous sulfate 325 (65 FE) MG tablet, Take 325 mg by mouth every other day. , Disp: , Rfl:    ibuprofen  (ADVIL ,MOTRIN ) 600 MG tablet, Take by mouth., Disp: , Rfl:    nitrofurantoin , macrocrystal-monohydrate, (MACROBID ) 100 MG capsule, Take 1 capsule (100 mg total) by mouth 2 (two) times daily., Disp: 10 capsule, Rfl: 0   pantoprazole (PROTONIX) 40 MG tablet, 40 mg daily. , Disp: , Rfl: 2   valACYclovir (VALTREX) 1000 MG tablet, Take 1,000 mg by mouth every other day. , Disp: , Rfl: 3   Allergies  Allergen Reactions   Bee Venom    Bacitracin Itching and Rash   Bactrim   [Sulfamethoxazole -Trimethoprim ] Rash   Methenamine-Sodium Salicylate Rash    Past Medical History:  Diagnosis Date   Anxiety    Complication of anesthesia    Hearing loss of both ears    Heartburn    HLD (hyperlipidemia)    HSV infection    PONV (postoperative nausea and vomiting)    RLS (restless legs syndrome)    Occurs on occasion, better now.     Past Surgical History:  Procedure Laterality Date   ABDOMINAL HYSTERECTOMY     CESAREAN SECTION  1992 & 1993   COCHLEAR IMPLANT Left 2004   CYSTOSCOPY N/A 11/19/2015   Procedure: CYSTOSCOPY;  Surgeon: Kris Pester, MD;  Location: [facility];  Service: Gynecology;  Laterality: N/A;   DILATION AND CURETTAGE OF UTERUS     ENDOMETRIAL ABLATION     FOOT SURGERY Left 2011   FOOT SURGERY Right 2008   HYSTEROSCOPY     LAPAROSCOPIC HYSTERECTOMY N/A 11/19/2015   Procedure: HYSTERECTOMY TOTAL LAPAROSCOPIC/BILATERAL SALPINGECTOMY;  Surgeon: Kris Pester, MD;  Location: [facility];  Service: Gynecology;  Laterality: N/A;   TONSILLECTOMY  1972   TUBAL LIGATION      Family History  Problem Relation Age of Onset   Hyperlipidemia Mother    Hypertension Mother    Hypertension Father    Hyperlipidemia Father    Kidney disease Neg Hx    Bladder Cancer Neg Hx    Breast cancer Neg Hx     Social History  Tobacco Use   Smoking status: Former    Current packs/day: 0.00    Types: Cigarettes    Quit date: 11/12/2011    Years since quitting: 12.4    Passive exposure: Past   Smokeless tobacco: Never  Vaping Use   Vaping status: Former  Substance Use Topics   Alcohol use: Yes    Alcohol/week: 0.0 standard drinks of alcohol    Comment: 3-5 drinks per year   Drug use: No    ROS Refer to HPI for ROS details.  Objective:   Vitals: BP 103/70 (BP Location: Left Arm)   Pulse 68   Temp 97.8 F (36.6 C) (Oral)   Resp 16   LMP 11/09/2015   SpO2 98%   Physical Exam Vitals and nursing note reviewed.  Constitutional:       General: She is not in acute distress.    Appearance: Normal appearance. She is well-developed. She is not ill-appearing or toxic-appearing.  HENT:     Head: Normocephalic and atraumatic.  Cardiovascular:     Rate and Rhythm: Normal rate.  Pulmonary:     Effort: Pulmonary effort is normal. No respiratory distress.  Abdominal:     General: There is no distension.     Palpations: Abdomen is soft.     Tenderness: There is no abdominal tenderness. There is no right CVA tenderness, left CVA tenderness, guarding or rebound.  Skin:    General: Skin is warm and dry.  Neurological:     General: No focal deficit present.     Mental Status: She is alert and oriented to person, place, and time.  Psychiatric:        Mood and Affect: Mood normal.        Behavior: Behavior normal.     Procedures  Results for orders placed or performed during the hospital encounter of 04/19/24 (from the past 24 hours)  Urinalysis, w/ Reflex to Culture (Infection Suspected) -Urine, Clean Catch     Status: Abnormal   Collection Time: 04/19/24  4:53 PM  Result Value Ref Range   Specimen Source URINE, CLEAN CATCH    Color, Urine YELLOW YELLOW   APPearance CLEAR CLEAR   Specific Gravity, Urine 1.025 1.005 - 1.030   pH 5.5 5.0 - 8.0   Glucose, UA NEGATIVE NEGATIVE mg/dL   Hgb urine dipstick NEGATIVE NEGATIVE   Bilirubin Urine NEGATIVE NEGATIVE   Ketones, ur 15 (A) NEGATIVE mg/dL   Protein, ur NEGATIVE NEGATIVE mg/dL   Nitrite NEGATIVE NEGATIVE   Leukocytes,Ua SMALL (A) NEGATIVE   Squamous Epithelial / HPF 0-5 0 - 5 /HPF   WBC, UA 11-20 0 - 5 WBC/hpf   RBC / HPF NONE SEEN 0 - 5 RBC/hpf   Bacteria, UA MANY (A) NONE SEEN    Assessment and Plan :     Discharge Instructions       1. Acute cystitis without hematuria (Primary) - Urinalysis performed in UC shows many bacteria, small leukocytes, no nitrite, no blood, these findings are strongly indicative of urinary tract infection - Urine Culture collected  and UC and sent to lab for further testing results should be available in 2 to 3 days - ciprofloxacin  (CIPRO ) 250 MG tablet; Take 1 tablet (250 mg total) by mouth every 12 (twelve) hours for 3 days.  Dispense: 6 tablet; Refill: 0 - phenazopyridine (PYRIDIUM) 200 MG tablet; Take 1 tablet (200 mg total) by mouth 3 (three) times daily.  Dispense: 6 tablet; Refill: 0 -Continue to  monitor symptoms for any change in severity if there is any escalation of current symptoms or development of new symptoms follow-up in ER for further evaluation and management.    Christina Bates   Christina Bates, Christina B, NP 04/19/24 1758

## 2024-04-19 NOTE — Discharge Instructions (Signed)
  1. Acute cystitis without hematuria (Primary) - Urinalysis performed in UC shows many bacteria, small leukocytes, no nitrite, no blood, these findings are strongly indicative of urinary tract infection - Urine Culture collected and UC and sent to lab for further testing results should be available in 2 to 3 days - ciprofloxacin  (CIPRO ) 250 MG tablet; Take 1 tablet (250 mg total) by mouth every 12 (twelve) hours for 3 days.  Dispense: 6 tablet; Refill: 0 - phenazopyridine (PYRIDIUM) 200 MG tablet; Take 1 tablet (200 mg total) by mouth 3 (three) times daily.  Dispense: 6 tablet; Refill: 0 -Continue to monitor symptoms for any change in severity if there is any escalation of current symptoms or development of new symptoms follow-up in ER for further evaluation and management.

## 2024-04-21 ENCOUNTER — Ambulatory Visit: Payer: Self-pay

## 2024-04-21 LAB — URINE CULTURE: Culture: >=100000 — AB

## 2024-08-04 ENCOUNTER — Other Ambulatory Visit: Payer: Self-pay | Admitting: Physician Assistant

## 2024-08-04 DIAGNOSIS — Z1231 Encounter for screening mammogram for malignant neoplasm of breast: Secondary | ICD-10-CM

## 2024-09-29 ENCOUNTER — Ambulatory Visit
Admission: RE | Admit: 2024-09-29 | Discharge: 2024-09-29 | Disposition: A | Discharge status: Home / Self Care | Source: Ambulatory Visit | Attending: Physician Assistant | Admitting: Physician Assistant

## 2024-09-29 DIAGNOSIS — Z1231 Encounter for screening mammogram for malignant neoplasm of breast: Secondary | ICD-10-CM | POA: Insufficient documentation

## 2024-11-06 ENCOUNTER — Inpatient Hospital Stay: Admission: RE | Admit: 2024-11-06 | Discharge: 2024-11-06 | Attending: Physician Assistant

## 2024-11-06 VITALS — BP 91/60 | HR 65 | Temp 97.8°F | Resp 18 | Wt 134.0 lb

## 2024-11-06 DIAGNOSIS — R300 Dysuria: Secondary | ICD-10-CM | POA: Diagnosis not present

## 2024-11-06 DIAGNOSIS — N3000 Acute cystitis without hematuria: Secondary | ICD-10-CM | POA: Diagnosis not present

## 2024-11-06 LAB — POCT URINE DIPSTICK
Bilirubin, UA: NEGATIVE
Blood, UA: NEGATIVE
Glucose, UA: NEGATIVE mg/dL
Ketones, POC UA: NEGATIVE mg/dL
Nitrite, UA: NEGATIVE
Protein Ur, POC: NEGATIVE mg/dL
Spec Grav, UA: 1.025 (ref 1.010–1.025)
Urobilinogen, UA: 1 U/dL
pH, UA: 6.5 (ref 5.0–8.0)

## 2024-11-06 MED ORDER — CIPROFLOXACIN HCL 250 MG PO TABS: 250.0000 mg | ORAL_TABLET | Freq: Two times a day (BID) | ORAL | 0 refills | Status: AC

## 2024-11-06 NOTE — ED Provider Notes (Signed)
 [facility]    CSN: [id number] Arrival date & time: 11/06/24  0805      History   Chief Complaint Chief Complaint  Patient presents with   Dysuria    Feels like another UTI - Entered by patient   Urinary Frequency    HPI Christina Bates is a 58 y.o. female presenting for 2-day history of dysuria, frequency and urgency.  Denies fever, abdominal pain, hematuria, flank pain, vaginal discharge.  Patient believes she has a UTI.  History of UTI a few months ago with similar symptoms.  Patient states she prefers to take Cipro  when she has a UTI.  Reports GI upset with Augmentin  and allergy to Macrobid  and Bactrim .  HPI  Past Medical History:  Diagnosis Date   Anxiety    Complication of anesthesia    Hearing loss of both ears    Heartburn    HLD (hyperlipidemia)    HSV infection    PONV (postoperative nausea and vomiting)    RLS (restless legs syndrome)    Occurs on occasion, better now.    Patient Active Problem List   Diagnosis Date Noted   Cochlear implant in place 11/25/2020   Dizziness 02/26/2018   Degenerative joint disease of cervical and lumbar spine 02/23/2018   Lumbar radiculopathy 09/17/2017   Pure hypercholesterolemia 03/24/2016   Acute neck pain 12/31/2015   Acute pain of left hip 12/31/2015   Anxiety 12/31/2015   Menorrhagia with irregular cycle 11/19/2015   Dysmenorrhea 11/19/2015   Status post laparoscopic hysterectomy 11/19/2015   Recurrent genital herpes 08/15/2015   Vitamin B 12 deficiency 08/15/2015   Low vitamin B12 level 03/20/2015   Vitamin D deficiency 10/06/2014   Absolute anemia 10/05/2014   Gastro-esophageal reflux disease without esophagitis 10/05/2014    Past Surgical History:  Procedure Laterality Date   ABDOMINAL HYSTERECTOMY     CESAREAN SECTION  1992 & 1993   COCHLEAR IMPLANT Left 2004   CYSTOSCOPY N/A 11/19/2015   Procedure: CYSTOSCOPY;  Surgeon: Garnette JONETTA Mace, MD;  Location: [facility];  Service: Gynecology;   Laterality: N/A;   DILATION AND CURETTAGE OF UTERUS     ENDOMETRIAL ABLATION     FOOT SURGERY Left 2011   FOOT SURGERY Right 2008   HYSTEROSCOPY     LAPAROSCOPIC HYSTERECTOMY N/A 11/19/2015   Procedure: HYSTERECTOMY TOTAL LAPAROSCOPIC/BILATERAL SALPINGECTOMY;  Surgeon: Garnette JONETTA Mace, MD;  Location: [facility];  Service: Gynecology;  Laterality: N/A;   TONSILLECTOMY  1972   TUBAL LIGATION      OB History   No obstetric history on file.      Home Medications    Prior to Admission medications  Medication Sig Start Date End Date Taking? Authorizing Provider  atorvastatin (LIPITOR) 40 MG tablet Take 40 mg by mouth. 08/28/24 10/02/25 Yes [provider]  ciprofloxacin  (CIPRO ) 250 MG tablet Take 1 tablet (250 mg total) by mouth every 12 (twelve) hours for 5 days. 11/06/24 11/11/24 Yes Arvis Huxley B, PA-C  pantoprazole (PROTONIX) 40 MG tablet 40 mg daily.  02/05/15  Yes [provider]  valACYclovir (VALTREX) 1000 MG tablet Take 1,000 mg by mouth every other day.  03/09/15  Yes [provider]  ALPRAZolam (XANAX) 0.25 MG tablet  12/31/15   [provider]  cholecalciferol (VITAMIN D) 1000 UNITS tablet Take 1,000 Units by mouth daily.    [provider]  Cranberry 500 MG CAPS Take by mouth.    [provider]  estradiol  (ESTRACE )  0.1 MG/GM vaginal cream Estrogen Cream Instruction Discard applicator Apply pea sized amount to tip of finger to urethra before bed. Wash hands well after application. Use Monday, Wednesday and Friday 02/22/23   Vaillancourt, Samantha, PA-C  ferrous sulfate 325 (65 FE) MG tablet Take 325 mg by mouth every other day.     [provider]  ibuprofen  (ADVIL ,MOTRIN ) 600 MG tablet Take by mouth. 11/20/15   [provider]  phenazopyridine  (PYRIDIUM ) 200 MG tablet Take 1 tablet (200 mg total) by mouth 3 (three) times daily. 04/19/24   Aurea Ethel NOVAK, NP    Family History Family History  Problem  Relation Age of Onset   Hyperlipidemia Mother    Hypertension Mother    Hypertension Father    Hyperlipidemia Father    Kidney disease Neg Hx    Bladder Cancer Neg Hx    Breast cancer Neg Hx     Social History Social History[1]   Allergies   Bee venom, Bacitracin, Bactrim  [sulfamethoxazole -trimethoprim ], Methenamine-sodium salicylate, and Nitrofurantoin    Review of Systems Review of Systems  Constitutional:  Negative for chills, fatigue and fever.  Gastrointestinal:  Negative for abdominal pain, diarrhea, nausea and vomiting.  Genitourinary:  Positive for dysuria, frequency and urgency. Negative for decreased urine volume, flank pain, hematuria, pelvic pain, vaginal bleeding, vaginal discharge and vaginal pain.  Musculoskeletal:  Negative for back pain.  Skin:  Negative for rash.     Physical Exam Triage Vital Signs ED Triage Vitals  Encounter Vitals Group     BP      Girls Systolic BP Percentile      Girls Diastolic BP Percentile      Boys Systolic BP Percentile      Boys Diastolic BP Percentile      Pulse      Resp      Temp      Temp src      SpO2      Weight      Height      Head Circumference      Peak Flow      Pain Score      Pain Loc      Pain Education      Exclude from Growth Chart    No data found.  Updated Vital Signs BP 91/60 (BP Location: Right Arm)   Pulse 65   Temp 97.8 F (36.6 C) (Oral)   Resp 18   Wt 134 lb (60.8 kg)   LMP 11/09/2015   SpO2 96%   BMI 24.51 kg/m    Physical Exam Vitals and nursing note reviewed.  Constitutional:      General: She is not in acute distress.    Appearance: Normal appearance. She is not ill-appearing or toxic-appearing.  HENT:     Head: Normocephalic and atraumatic.  Eyes:     General: No scleral icterus.       Right eye: No discharge.        Left eye: No discharge.     Conjunctiva/sclera: Conjunctivae normal.  Cardiovascular:     Rate and Rhythm: Normal rate and regular rhythm.     Heart  sounds: Normal heart sounds.  Pulmonary:     Effort: Pulmonary effort is normal. No respiratory distress.     Breath sounds: Normal breath sounds.  Abdominal:     Palpations: Abdomen is soft.     Tenderness: There is no abdominal tenderness. There is no right CVA tenderness or left CVA tenderness.  Musculoskeletal:     Cervical back: Neck supple.  Skin:    General: Skin is dry.  Neurological:     General: No focal deficit present.     Mental Status: She is alert. Mental status is at baseline.     Motor: No weakness.     Gait: Gait normal.  Psychiatric:        Mood and Affect: Mood normal.        Behavior: Behavior normal.      UC Treatments / Results  Labs (all labs ordered are listed, but only abnormal results are displayed) Labs Reviewed  POCT URINE DIPSTICK - Abnormal; Notable for the following components:      Result Value   Leukocytes, UA Small (1+) (*)    All other components within normal limits  URINE CULTURE    EKG   Radiology No results found.  Procedures Procedures (including critical care time)  Medications Ordered in UC Medications - No data to display  Initial Impression / Assessment and Plan / UC Course  I have reviewed the triage vital signs and the nursing notes.  Pertinent labs & imaging results that were available during my care of the patient were reviewed by me and considered in my medical decision making (see chart for details).   58 year old female presents for dysuria, frequency and urgency x 2 days.  No fever, abdominal pain or flank pain.  UA has leukocytes.  Will send for culture and treat for UTI.  Sent Cipro  250 mg twice daily x 5 days.  Encouraged increasing rest and fluids.  May amend treatment based on culture result.  Reviewed return precautions.   Final Clinical Impressions(s) / UC Diagnoses   Final diagnoses:  Dysuria  Acute cystitis without hematuria     Discharge Instructions      UTI: Based on either symptoms or  urinalysis, you may have a urinary tract infection. We will send the urine for culture and call with results in a few days. Begin antibiotics at this time. Your symptoms should be much improved over the next 2-3 days. Increase rest and fluid intake. If for some reason symptoms are worsening or not improving after a couple of days or the urine culture determines the antibiotics you are taking will not treat the infection, the antibiotics may be changed. Return or go to ER for fever, back pain, worsening urinary pain, discharge, increased blood in urine. May take Tylenol  or Motrin  OTC for pain relief or consider AZO if no contraindications      ED Prescriptions     Medication Sig Dispense Auth. Provider   ciprofloxacin  (CIPRO ) 250 MG tablet Take 1 tablet (250 mg total) by mouth every 12 (twelve) hours for 5 days. 10 tablet Berlene Dixson B, PA-C      PDMP not reviewed this encounter.     [1]  Social History Tobacco Use   Smoking status: Former    Current packs/day: 0.00    Types: Cigarettes    Quit date: 11/12/2011    Years since quitting: 12.9    Passive exposure: Past   Smokeless tobacco: Never  Vaping Use   Vaping status: Former  Substance Use Topics   Alcohol use: Yes    Alcohol/week: 0.0 standard drinks of alcohol    Comment: 3-5 drinks per year   Drug use: No     Arvis Jolan NOVAK, PA-C 11/06/24 [id number]

## 2024-11-06 NOTE — ED Triage Notes (Signed)
 Sx x 2 days Urinary frequency  Dysuria   Dont wear mask, patient is deaf

## 2024-11-06 NOTE — Discharge Instructions (Addendum)

## 2024-11-08 ENCOUNTER — Ambulatory Visit (HOSPITAL_COMMUNITY): Payer: Self-pay

## 2024-11-08 LAB — URINE CULTURE: Culture: >=100000 — AB

## 2025-02-05 ENCOUNTER — Ambulatory Visit: Admitting: Physician Assistant

## 2025-02-09 ENCOUNTER — Ambulatory Visit: Admitting: Physician Assistant
